# Patient Record
Sex: Male | Born: 2005 | Race: White | Hispanic: No | Marital: Single | Smoking: Never smoker
Health system: Southern US, Community
[De-identification: ages and names within clinical notes are randomized; demographics above are authoritative.]

## PROBLEM LIST (undated history)

## (undated) DIAGNOSIS — F419 Anxiety disorder, unspecified: Secondary | ICD-10-CM

## (undated) HISTORY — DX: Anxiety disorder, unspecified: F41.9

---
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
## 2023-02-06 ENCOUNTER — Encounter: Payer: Self-pay | Admitting: Internal Medicine

## 2023-02-06 ENCOUNTER — Ambulatory Visit: Payer: No Typology Code available for payment source | Admitting: Internal Medicine

## 2023-02-06 VITALS — BP 120/84 | HR 83 | Temp 99.9°F | Ht 70.09 in | Wt 131.0 lb

## 2023-02-06 DIAGNOSIS — Z79899 Other long term (current) drug therapy: Secondary | ICD-10-CM

## 2023-02-06 DIAGNOSIS — F900 Attention-deficit hyperactivity disorder, predominantly inattentive type: Secondary | ICD-10-CM

## 2023-02-06 MED ORDER — METHYLPHENIDATE HCL ER (CD) 60 MG PO CPCR: 60.0000 mg | ORAL_CAPSULE | ORAL | 0 refills | Status: DC

## 2023-02-06 NOTE — Progress Notes (Signed)
[facility]: [phone]   Routine Medical Office Visit  Patient:  Nathan Hill      Age: 17 y.o.       Sex:  male  Date:   02/06/2023 PCP:    Nathan Olszewski, MD   Today's Healthcare Provider: Lula Olszewski, MD   Assessment and Plan:   AI-Extracted Assessment and Plan    Attention Deficit Hyperactivity Disorder (ADHD): He is stable on his current regimen of Strattera 10mg  daily and Metadate CD 60mg  daily. We discussed the possibility of increasing the Strattera dose due to its low current dose; however, he expressed hesitance and a preference to maintain the current dose. We will continue Strattera 10mg  daily and Metadate CD 60mg  daily. We will refill Metadate CD 60mg  for 90 days, to be filled when due. He is encouraged to use medications as prescribed and to maintain regular follow-up. Follow-up in 90-95 days for medication management and to assess ADHD control.        Charting-Extracted Assessment and Plan [name] was seen today for 3 month follow-up.  Attention deficit hyperactivity disorder (ADHD), predominantly inattentive type Overview: Takes methylphenidate and atomoxetine since age 4th-5th grade Reports these work well, the atomoxetine is because he is maxed out on high dose methylphenidate, but was nervous about dose increasing straterra Pediatric records in chart confirming adhd diagnosis  Assessment & Plan: Offered to increase Strattera but makes him nervous Updated problem overview / problem list entry for this problem  Last prescription drug monitoring program check: PDMP reviewed during this encounter. 02/06/2023  Refilled medication(s) unchanged as per patient request using electronic prescribing of controlled substances, sequential prescription for 90 days supply. 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 He has history doubting this diagnosis and so I had referred care for neuropsych  evaluate but haven't received analysis Autism spectrum disorder seems likely but fulll testing yet to confirm.  Even if autism spectrum disorder is diagnosed I would recommend continue Attention Deficit Hyperactivity Disorder (ADHD) medication(s)   Orders: -     Methylphenidate HCl ER (CD); Take 1 capsule (60 mg total) by mouth every morning.  Dispense: 30 capsule; Refill: 0 -     Methylphenidate HCl ER (CD); Take 1 capsule (60 mg total) by mouth every morning.  Dispense: 30 capsule; Refill: 0 -     Methylphenidate HCl ER (CD); Take 1 capsule (60 mg total) by mouth every morning.  Dispense: 30 capsule; Refill: 0  High risk medication use Overview: Methylphenidate 60 mg long term for Attention Deficit Hyperactivity Disorder (ADHD)  Controlled sub contract 05/13/22 uds 05/13/22 Last prescription drug monitoring program check: PDMP reviewed during this encounter. 02/06/2023  Adherence: He reports excellent adherence to medication regimen and expectations. No evidence of misuse or diversion identified at this visit.  Assessment & Plan: Updated problem overview / problem list entry for this problem  Last prescription drug monitoring program check: PDMP reviewed during this encounter. 02/06/2023  Refilled medication(s) unchanged as per patient request using electronic prescribing of controlled substances, sequential prescription for 90 days supply. 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 He has history doubting this diagnosis and so I had referred care for neuropsych evaluate but haven't received analysis Autism spectrum disorder seems likely but fulll testing yet to confirm.  Even if autism spectrum disorder is diagnosed I would recommend continue Attention Deficit Hyperactivity Disorder (ADHD) medication(s)      Treatment plan discussed and reviewed  in detail. Explained medication safety and potential side effects.  Answered all patient questions and  confirmed understanding and comfort with the plan. Encouraged patient to contact our office if they have any questions or concerns. Agreed on patient returning to office if symptoms worsen, persist, or new symptoms develop. Discussed precautions in case of needing to visit the Emergency Department.         Clinical Presentation:    17 y.o. male here today for 3 month follow-up  AI-Extracted: Discussed the use of AI scribe software for clinical note transcription with the patient, who gave verbal consent to proceed.  History of Present Illness   The patient, currently on Strattera 10 mg daily and Metadate CD 60 mg daily for ADHD, presented for a medication review. He reported no issues with the current regimen and denied any new symptoms. The patient expressed satisfaction with the current dose of Strattera, despite the provider's suggestion of a potential increase due to its relatively low dosage. He also reported having approximately ten doses of methylphenidate left and inquired about the process for prescription renewal.  His employment was discussed, with the patient expressing a desire for more consistent work hours. His medication adherence was commended, and he was reminded of the importance of these medications in supporting his daily functioning and potential career advancement.  The patient was reminded of the need for regular 90-day follow-ups due to the regulations surrounding his prescribed medications.        Reviewed chart data: Active Ambulatory Problems    Diagnosis Date Noted   Attention deficit hyperactivity disorder 03/12/2020   Scoliosis 05/13/2022   High risk medication use 05/13/2022   Cognitive and neurobehavioral dysfunction 08/12/2022   Resolved Ambulatory Problems    Diagnosis Date Noted   Sinusitis 12/31/2020   Medication monitoring encounter 12/31/2020   Past Medical History:  Diagnosis Date   Anxiety     Outpatient Medications Prior to Visit   Medication Sig   Adapalene-Benzoyl Peroxide 0.3-2.5 % GEL 1 application Externally Once at night for 30 days   atomoxetine (STRATTERA) 10 MG capsule Take 1 capsule (10 mg total) by mouth daily.   Benzoyl Peroxide (PANOXYL FOAMING WASH) 10 % LIQD 1 application Externally Once a day   chlorhexidine (PERIDEX) 0.12 % solution SMARTSIG:0.5 Ounce(s) By Mouth Twice Daily   clindamycin (CLINDAGEL) 1 % gel 1 application Externally Pea size amount to whole face in the morning   methylphenidate (METADATE CD) 60 MG CR capsule Take 1 capsule (60 mg total) by mouth every morning.   methylphenidate (METADATE CD) 60 MG CR capsule Take 1 capsule (60 mg total) by mouth every morning.   tretinoin (RETIN-A) 0.025 % cream 1 application in the evening to face Externally Pea size amount to whole face every other night for 30 days   [DISCONTINUED] methylphenidate (METADATE CD) 60 MG CR capsule Take 1 capsule (60 mg total) by mouth every morning.   No facility-administered medications prior to visit.         Clinical Data Analysis:   Physical Exam  BP 120/84   Pulse 83   Temp 99.9 F (37.7 C) (Temporal)   Ht 5' 10.09" (1.78 m)   Wt 131 lb (59.4 kg)   SpO2 98%   BMI 18.75 kg/m  Wt Readings from Last 10 Encounters:  02/06/23 131 lb (59.4 kg) (14 %, Z= -1.09)*  11/16/22 135 lb 12.8 oz (61.6 kg) (21 %, Z= -0.79)*  08/12/22 135 lb 12.8 oz (61.6 kg) (  23 %, Z= -0.75)*  05/13/22 136 lb 12.8 oz (62.1 kg) (26 %, Z= -0.65)*   * Growth percentiles are based on CDC (Boys, 2-20 Years) data.   Vital signs reviewed.  Nursing notes reviewed. Weight trend reviewed. Abnormalities and Problem-Specific physical exam findings:  seems anxious, polite.  General Appearance:  No acute distress appreciable.   Well-groomed, healthy-appearing male.  Well proportioned with no abnormal fat distribution.  Good muscle tone. Skin: Clear and well-hydrated. Pulmonary:  Normal work of breathing at rest, no respiratory distress apparent.  SpO2: 98 %  Musculoskeletal: All extremities are intact.  Neurological:  Awake, alert, oriented, and engaged.  No obvious focal neurological deficits or cognitive impairments.  Sensorium seems unclouded.   Speech is clear and coherent with logical content. Psychiatric:  Appropriate mood, pleasant and cooperative demeanor, thoughtful and engaged during the exam    Signed: Lula Olszewski, MD 02/06/2023 8:38 PM

## 2023-02-06 NOTE — Assessment & Plan Note (Addendum)
Offered to increase Strattera but makes him nervous Updated problem overview / problem list entry for this problem  Last prescription drug monitoring program check: PDMP reviewed during this encounter. 02/06/2023  Refilled medication(s) unchanged as per patient request using electronic prescribing of controlled substances, sequential prescription for 90 days supply. 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 He has history doubting this diagnosis and so I had referred care for neuropsych evaluate but haven't received analysis Autism spectrum disorder seems likely but fulll testing yet to confirm.  Even if autism spectrum disorder is diagnosed I would recommend continue Attention Deficit Hyperactivity Disorder (ADHD) medication(s)

## 2023-02-06 NOTE — Assessment & Plan Note (Addendum)
Updated problem overview / problem list entry for this problem  Last prescription drug monitoring program check: PDMP reviewed during this encounter. 02/06/2023  Refilled medication(s) unchanged as per patient request using electronic prescribing of controlled substances, sequential prescription for 90 days supply. 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 He has history doubting this diagnosis and so I had referred care for neuropsych evaluate but haven't received analysis Autism spectrum disorder seems likely but fulll testing yet to confirm.  Even if autism spectrum disorder is diagnosed I would recommend continue Attention Deficit Hyperactivity Disorder (ADHD) medication(s)

## 2023-04-13 ENCOUNTER — Encounter (INDEPENDENT_AMBULATORY_CARE_PROVIDER_SITE_OTHER): Payer: Self-pay

## 2023-04-16 ENCOUNTER — Encounter: Payer: Self-pay | Admitting: Internal Medicine

## 2023-04-27 ENCOUNTER — Ambulatory Visit: Payer: No Typology Code available for payment source | Admitting: Internal Medicine

## 2023-04-27 ENCOUNTER — Encounter: Payer: Self-pay | Admitting: Internal Medicine

## 2023-04-27 VITALS — BP 100/78 | HR 99 | Temp 100.3°F | Ht 70.11 in | Wt 129.6 lb

## 2023-04-27 DIAGNOSIS — Z79899 Other long term (current) drug therapy: Secondary | ICD-10-CM | POA: Diagnosis not present

## 2023-04-27 DIAGNOSIS — F900 Attention-deficit hyperactivity disorder, predominantly inattentive type: Secondary | ICD-10-CM

## 2023-04-27 DIAGNOSIS — F840 Autistic disorder: Secondary | ICD-10-CM | POA: Diagnosis not present

## 2023-04-27 MED ORDER — METHYLPHENIDATE HCL ER (CD) 60 MG PO CPCR: 60.0000 mg | ORAL_CAPSULE | ORAL | 0 refills | Status: DC

## 2023-04-27 MED ORDER — ATOMOXETINE HCL 10 MG PO CAPS: 10.0000 mg | ORAL_CAPSULE | Freq: Every day | ORAL | 3 refills | Status: DC

## 2023-04-27 MED ORDER — METHYLPHENIDATE HCL ER (CD) 60 MG PO CPCR: 60.0000 mg | ORAL_CAPSULE | Freq: Every morning | ORAL | 0 refills | Status: DC

## 2023-04-27 NOTE — Progress Notes (Signed)
[facility]: [phone]   -- Medical Office Visit --  Patient:  Nathan Hill      Age: 17 y.o.       Sex:  male  Date:   04/27/2023 Patient Care Team: Lula Olszewski, MD as PCP - General (Internal Medicine) Today's Healthcare Provider: Lula Olszewski, MD      Assessment & Plan Attention deficit hyperactivity disorder (ADHD), predominantly inattentive type He is currently treated with Metadate CD 60mg  and Straterra for ADHD and finds the medication effective, yet he is open to exploring other options. We discussed the potential benefits of [facility] testing to identify the most suitable medication. We will refill Metadate CD 60mg  and Straterra for three months and order Genesight testing once he has signed up on the website. Based on the [place] results, we may adjust his medication. High risk medication use Assessment: Risks and benefits were weighed and continued maintenance of the controlled substance prescription will be provided.   Continued education about risks and benefits and safe use was also provided.  Importance of securing medications has been reviewed.  Relevant comorbid conditions include.  These factors are taken into account in the overall treatment plan to minimize potential interactions or complications.    PDMP reviewed during this encounter.  Reviewed Urine Drug Screening Data in problem overview needs repeat soon but not able to pee today. Placed future order  We discussed the necessity of periodic drug screening and pill counts due to the use of controlled substances. We will order a future urine drug screen and request he provide a photo of his pill organizer as proof of consistent medication use.  Autism spectrum disorder He is currently undergoing a diagnostic evaluation for Autism Spectrum Disorder. We discussed how a confirmed diagnosis could benefit him by enhancing his understanding and management of the condition. The ongoing  diagnostic evaluation will continue. He is interested in [employer] testing and I helped him to understand his options for getting that test- see AVS   Follow-up is scheduled for 90 days or sooner if [place] results are available.        Diagnoses and all orders for this visit: High risk medication use Attention deficit hyperactivity disorder (ADHD), predominantly inattentive type -     methylphenidate (METADATE CD) 60 MG CR capsule; Take 1 capsule (60 mg total) by mouth every morning. -     methylphenidate (METADATE CD) 60 MG CR capsule; Take 1 capsule (60 mg total) by mouth every morning. -     methylphenidate (METADATE CD) 60 MG CR capsule; Take 1 capsule (60 mg total) by mouth every morning. -     atomoxetine (STRATTERA) 10 MG capsule; Take 1 capsule (10 mg total) by mouth daily. -     Drug Screen, 5 Panel, Ur; Future Adolescent idiopathic scoliosis of thoracolumbar region Autism spectrum disorder  Recommended follow-up: 90 days  Future Appointments  Date Time Provider Department Center  06/22/2023  4:00 PM Altabet, Salvatore Decent, PhD [facility] None  06/28/2023 12:30 PM Altabet, Salvatore Decent, PhD [facility] None  06/29/2023 12:30 PM Altabet, Salvatore Decent, PhD [facility] None  07/07/2023 10:30 AM Altabet, Salvatore Decent, PhD [facility] None  07/31/2023  1:40 PM Lula Olszewski, MD [facility]            Subjective   17 y.o. male who has Attention deficit hyperactivity disorder; Scoliosis; High risk medication use; and Cognitive and neurobehavioral dysfunction on their problem list. His reasons/main concerns/chief complaints for  today's office visit are 3 month follow-up   ------------------------------------------------------------------------------------------------------------------------ AI-Extracted: Discussed the use of AI scribe software for clinical note transcription with the patient, who gave verbal consent to proceed.  History of Present Illness   The patient, who is  currently undergoing therapy for autism, presented primarily to discuss the [facility] testing. He has only had one session of therapy so far, which involved answering questions to confirm the diagnosis of autism. The patient is also on medication for ADHD, specifically Methylphenidate and Stratara, which he has been taking consistently. He expressed uncertainty about the effectiveness of the current ADHD medication due to the long duration of use and is interested in exploring other potentially more effective options through [place] testing. The patient is compliant with medication and uses a weekly pill organizer to ensure consistent intake. No new symptoms or health concerns were reported during this visit.      He has a past medical history of Anxiety and Scoliosis (05/13/2022).  Problem list overviews that were updated at today's visit: Problem  High Risk Medication Use   Methylphenidate 60 mg long term for Attention Deficit Hyperactivity Disorder (ADHD)  Controlled sub contract 05/13/22 uds 05/13/22 Last prescription drug monitoring program check: PDMP reviewed during this encounter. 02/06/2023  Adherence: He reports excellent adherence to medication regimen and expectations. No evidence of misuse or diversion identified at this visit.   Attention Deficit Hyperactivity Disorder   Takes methylphenidate and atomoxetine since age 4th-5th grade Reports these work well, the atomoxetine is because he is maxed out on high dose methylphenidate, but was nervous about dose increasing straterra Pediatric records in chart confirming adhd diagnosis    Current Outpatient Medications on File Prior to Visit  Medication Sig   Adapalene-Benzoyl Peroxide 0.3-2.5 % GEL 1 application Externally Once at night for 30 days   Benzoyl Peroxide (PANOXYL FOAMING WASH) 10 % LIQD 1 application Externally Once a day   chlorhexidine (PERIDEX) 0.12 % solution SMARTSIG:0.5 Ounce(s) By Mouth Twice Daily   clindamycin  (CLINDAGEL) 1 % gel 1 application Externally Pea size amount to whole face in the morning   methylphenidate (METADATE CD) 60 MG CR capsule Take 1 capsule (60 mg total) by mouth every morning.   methylphenidate (METADATE CD) 60 MG CR capsule Take 1 capsule (60 mg total) by mouth every morning.   tretinoin (RETIN-A) 0.025 % cream 1 application in the evening to face Externally Pea size amount to whole face every other night for 30 days   No current facility-administered medications on file prior to visit.   Medications Discontinued During This Encounter  Medication Reason   clindamycin (CLINDAGEL) 1 % gel    atomoxetine (STRATTERA) 10 MG capsule Reorder   methylphenidate (METADATE CD) 60 MG CR capsule Reorder   methylphenidate (METADATE CD) 60 MG CR capsule Reorder   methylphenidate (METADATE CD) 60 MG CR capsule Reorder     Objective   Physical Exam  BP 100/78 (BP Location: Left Arm, Patient Position: Sitting)   Pulse 99   Temp 100.3 F (37.9 C) (Temporal)   Ht 5' 10.11" (1.781 m)   Wt 129 lb 9.6 oz (58.8 kg)   SpO2 97%   BMI 18.54 kg/m  Wt Readings from Last 10 Encounters:  04/27/23 129 lb 9.6 oz (58.8 kg) (12%, Z= -1.20)*  02/06/23 131 lb (59.4 kg) (14%, Z= -1.09)*  11/16/22 135 lb 12.8 oz (61.6 kg) (21%, Z= -0.79)*  08/12/22 135 lb 12.8 oz (61.6 kg) (23%, Z= -0.75)*  05/13/22  136 lb 12.8 oz (62.1 kg) (26%, Z= -0.65)*   * Growth percentiles are based on CDC (Boys, 2-20 Years) data.   Vital signs reviewed.  Nursing notes reviewed. Weight trend reviewed. Abnormalities and Problem-Specific physical exam findings:  stimming, unusual speech patterning consistent with prior visits and autism spectrum disorder   General Appearance:  No acute distress appreciable.   Well-groomed, healthy-appearing male.  Well proportioned with no abnormal fat distribution.  Good muscle tone. Pulmonary:  Normal work of breathing at rest, no respiratory distress apparent. SpO2: 97 %  Musculoskeletal:  All extremities are intact.  Neurological:  Awake, alert, oriented, and engaged.  No obvious focal neurological deficits or cognitive impairments.  Sensorium seems unclouded.   Speech is clear and coherent with logical content. Psychiatric:  Appropriate mood, pleasant and cooperative demeanor, thoughtful and engaged during the exam  Results            No results found for any visits on 04/27/23.  Lab on 08/18/2022  Component Date Value   WBC 08/18/2022 7.2    RBC 08/18/2022 5.53    Hemoglobin 08/18/2022 15.8    HCT 08/18/2022 47.2    MCV 08/18/2022 85.3    MCHC 08/18/2022 33.6    RDW 08/18/2022 13.2    Platelets 08/18/2022 274.0    Neutrophils Relative % 08/18/2022 41.0 (L)    Lymphocytes Relative 08/18/2022 38.5    Monocytes Relative 08/18/2022 9.9    Eosinophils Relative 08/18/2022 9.9 (H)    Basophils Relative 08/18/2022 0.7    Neutro Abs 08/18/2022 2.9    Lymphs Abs 08/18/2022 2.8    Monocytes Absolute 08/18/2022 0.7    Eosinophils Absolute 08/18/2022 0.7    Basophils Absolute 08/18/2022 0.1    Sodium 08/18/2022 140    Potassium 08/18/2022 4.0    Chloride 08/18/2022 102    CO2 08/18/2022 31    Glucose, Bld 08/18/2022 83    BUN 08/18/2022 10    Creatinine, Ser 08/18/2022 1.01    Total Bilirubin 08/18/2022 0.4    Alkaline Phosphatase 08/18/2022 80    AST 08/18/2022 15    ALT 08/18/2022 12    Total Protein 08/18/2022 7.1    Albumin 08/18/2022 4.6    GFR 08/18/2022 108.25    Calcium 08/18/2022 9.8    Cholesterol 08/18/2022 148    Triglycerides 08/18/2022 95.0    HDL 08/18/2022 52.20    VLDL 08/18/2022 19.0    LDL Cholesterol 08/18/2022 77    Total CHOL/HDL Ratio 08/18/2022 3    NonHDL 08/18/2022 96.06   Office Visit on 05/13/2022  Component Date Value   Alcohol Metabolites 05/13/2022 NEGATIVE    Amphetamines 05/13/2022 NEGATIVE    Benzodiazepines 05/13/2022 NEGATIVE    Buprenorphine, Urine 05/13/2022 NEGATIVE    Cocaine Metabolite 05/13/2022 NEGATIVE    6  Acetylmorphine 05/13/2022 NEGATIVE    Marijuana Metabolite 05/13/2022 NEGATIVE    MDMA 05/13/2022 NEGATIVE    Opiates 05/13/2022 NEGATIVE    Oxycodone 05/13/2022 NEGATIVE    Creatinine 05/13/2022 105.6    pH 05/13/2022 7.3    Oxidant 05/13/2022 NEGATIVE    Notes and Comments 05/13/2022     No image results found.   No results found.  DG Ankle Complete Right  Result Date: 11/26/2010 *RADIOLOGY REPORT* Clinical Data: Fell twisting ankle with pain laterally RIGHT ANKLE - COMPLETE 3+ VIEW Comparison: None. Findings: The ankle joint appears normal.  No acute fracture is seen.  Alignment is normal.  There is soft tissue swelling over lateral malleolus.  IMPRESSION: No acute fracture. Original Report Authenticated By: Juline Patch, M.D.      Additional Info: This encounter employed real-time, collaborative documentation. The patient actively reviewed and updated their medical record on a shared screen, ensuring transparency and facilitating joint problem-solving for the problem list, overview, and plan. This approach promotes accurate, informed care. The treatment plan was discussed and reviewed in detail, including medication safety, potential side effects, and all patient questions. We confirmed understanding and comfort with the plan. Follow-up instructions were established, including contacting the office for any concerns, returning if symptoms worsen, persist, or new symptoms develop, and precautions for potential emergency department visits.

## 2023-04-27 NOTE — Patient Instructions (Signed)
VISIT SUMMARY:  During your visit, we discussed your ongoing therapy for autism and your current medication for ADHD. You expressed interest in exploring other potentially more effective medication options through [place] testing. We also discussed the importance of regular drug screening and pill counts due to your use of controlled substances.  YOUR PLAN:  -AUTISM SPECTRUM DISORDER: You are currently undergoing a diagnostic evaluation for Autism Spectrum Disorder, a condition that affects communication and behavior. This evaluation will continue to help [place] better understand and manage your condition.  -ADHD: You are currently taking Metadate CD 60mg  and Straterra for ADHD, a condition that affects attention and self-control. We will refill your medication for three months and order [facility] testing, which can help identify the most suitable medication for you.  -REGULATORY COMPLIANCE: Due to your use of controlled substances, it's important that we conduct regular drug screenings and pill counts. We will order a future urine drug screen and request a photo of your pill organizer to ensure consistent medication use.  INSTRUCTIONS:  Please sign up for Genesight testing on their website. We will order the test once you have signed up. Follow-up is scheduled for 90 days or sooner if [place] results are available. Please continue to take your medication as prescribed and provide a photo of your pill organizer as proof of consistent use.

## 2023-04-27 NOTE — Assessment & Plan Note (Signed)
He is currently treated with Metadate CD 60mg  and Straterra for ADHD and finds the medication effective, yet he is open to exploring other options. We discussed the potential benefits of Genesight testing to identify the most suitable medication. We will refill Metadate CD 60mg  and Straterra for three months and order [facility] testing once he has signed up on the website. Based on the [place] results, we may adjust his medication.

## 2023-04-27 NOTE — Assessment & Plan Note (Addendum)
Assessment: Risks and benefits were weighed and continued maintenance of the controlled substance prescription will be provided.   Continued education about risks and benefits and safe use was also provided.  Importance of securing medications has been reviewed.  Relevant comorbid conditions include.  These factors are taken into account in the overall treatment plan to minimize potential interactions or complications.    PDMP reviewed during this encounter.  Reviewed Urine Drug Screening Data in problem overview needs repeat soon but not able to pee today. Placed future order  We discussed the necessity of periodic drug screening and pill counts due to the use of controlled substances. We will order a future urine drug screen and request he provide a photo of his pill organizer as proof of consistent medication use.

## 2023-05-10 ENCOUNTER — Ambulatory Visit: Payer: No Typology Code available for payment source | Admitting: Internal Medicine

## 2023-06-22 ENCOUNTER — Ambulatory Visit: Payer: No Typology Code available for payment source | Admitting: Psychology

## 2023-06-28 ENCOUNTER — Other Ambulatory Visit: Payer: Self-pay | Admitting: Internal Medicine

## 2023-06-28 ENCOUNTER — Ambulatory Visit: Payer: No Typology Code available for payment source | Admitting: Psychology

## 2023-06-28 DIAGNOSIS — F900 Attention-deficit hyperactivity disorder, predominantly inattentive type: Secondary | ICD-10-CM

## 2023-06-29 ENCOUNTER — Ambulatory Visit: Payer: No Typology Code available for payment source | Admitting: Psychology

## 2023-07-03 ENCOUNTER — Ambulatory Visit: Payer: No Typology Code available for payment source | Admitting: Internal Medicine

## 2023-07-03 VITALS — BP 117/72 | HR 80 | Temp 98.4°F | Ht 70.0 in | Wt 132.0 lb

## 2023-07-03 DIAGNOSIS — Z79899 Other long term (current) drug therapy: Secondary | ICD-10-CM | POA: Diagnosis not present

## 2023-07-03 DIAGNOSIS — F900 Attention-deficit hyperactivity disorder, predominantly inattentive type: Secondary | ICD-10-CM

## 2023-07-03 DIAGNOSIS — F09 Unspecified mental disorder due to known physiological condition: Secondary | ICD-10-CM | POA: Diagnosis not present

## 2023-07-03 DIAGNOSIS — F902 Attention-deficit hyperactivity disorder, combined type: Secondary | ICD-10-CM

## 2023-07-03 MED ORDER — METHYLPHENIDATE HCL ER (CD) 60 MG PO CPCR: 60.0000 mg | ORAL_CAPSULE | ORAL | 0 refills | Status: DC

## 2023-07-03 NOTE — Assessment & Plan Note (Addendum)
Assessment: Risks and benefits were weighed and continued maintenance of the controlled substance prescription will be provided.   Continued education about risks and benefits and safe use was also provided.  Importance of securing medications has been reviewed. Relevant comorbid conditions include suspected neurocognitive disorder(s) (autism spectrum disorder not confirmed).  These factors are taken into account in the overall treatment plan to minimize potential interactions or complications.   Advised patient to upload images of pills monthly if possible to demonstrate adherence and prevent diversion.  PDMP reviewed during this encounter. Regular fill pattern Reviewed Urine Drug Screening Data in problem overview due for repeat, obtained today New controlled substance(s) contract completed today 07/03/23   Adherence:  No evidence of misuse or diversion has been identified during this visit, and there is no suspicion of such behavior.  Patient has  been briefed on our diversion prevention protocol, which is integral to our medication management strategy.  Explained and confirmed understanding of the importance of bringing medications in their original containers for verification, the option of submitting time-stamped medication photos via MyChart, and the necessity of routine urine drug screenings.   Terms were agreed upon, signed by written contract, and this is in place to ensure the safe and effective use of medically necessary controlled substance prescriptions, and to fulfill our regulatory obligations.  Adherence to our prescribing agreement has been exemplary, with no indications of misuse or diversion. We will continue to monitor and support the agreed-upon treatment plan, ensuring compliance with safety protocols and regulatory requirements.

## 2023-07-03 NOTE — Assessment & Plan Note (Addendum)
At last visit - Metadate CD 60mg  and Straterra for ADHD and finds the medication effective, and we discussed the potential benefits of [facility] testing to identify the most suitable medication.  We don't have the results for adjustment... but he seems to be doing well with current so not interested in adjusting

## 2023-07-03 NOTE — Patient Instructions (Signed)
VISIT SUMMARY:  During today's visit, we reviewed your current ADHD medication regimen. You reported that you are satisfied with the effectiveness of your medications, Metadate CD 60mg  and Strattera, and have not experienced any adverse effects. We discussed your adherence to the medication schedule and confirmed that you are generally following it well. We also planned for a routine drug screening and updated your controlled substance contract.  YOUR PLAN:  -ADHD: ADHD, or Attention-Deficit/Hyperactivity Disorder, is a condition characterized by symptoms of inattention, hyperactivity, and impulsivity. You are currently stable on Metadate CD 60mg  and Strattera, with no reported side effects. We will continue with your current medications and monitor your adherence through periodic photos of your pill bottles and containers. A routine drug screening was performed today, and we updated your controlled substance contract. Please confirm with your pharmacy regarding your refill schedule to help decide timing for next appointment, which can be video if desired.  INSTRUCTIONS:  Your next appointment is scheduled for approximately 90 days. Please confirm the date with the office. Additionally, ensure you confirm your refill schedule with the pharmacy. Consider scheduling a wellness visit in 90 days.

## 2023-07-03 NOTE — Progress Notes (Signed)
[facility]: [phone]   -- Medical Office Visit --  Patient:  Nathan Hill      Age: 17 y.o.       Sex:  male  Date:   07/03/2023 Today's Healthcare Provider: Lula Olszewski, MD  =============================================================================================     Assessment & Plan Attention deficit hyperactivity disorder (ADHD), combined type At last visit - Metadate CD 60mg  and Straterra for ADHD and finds the medication effective, and we discussed the potential benefits of Genesight testing to identify the most suitable medication.  We don't have the results for adjustment... but he seems to be doing well with current so not interested in adjusting High risk medication use Assessment: Risks and benefits were weighed and continued maintenance of the controlled substance prescription will be provided.   Continued education about risks and benefits and safe use was also provided.  Importance of securing medications has been reviewed. Relevant comorbid conditions include suspected neurocognitive disorder(s) (autism spectrum disorder not confirmed).  These factors are taken into account in the overall treatment plan to minimize potential interactions or complications.   Advised patient to upload images of pills monthly if possible to demonstrate adherence and prevent diversion.  PDMP reviewed during this encounter. Regular fill pattern Reviewed Urine Drug Screening Data in problem overview due for repeat, obtained today New controlled substance(s) contract completed today 07/03/23   Adherence:  No evidence of misuse or diversion has been identified during this visit, and there is no suspicion of such behavior.  Patient has  been briefed on our diversion prevention protocol, which is integral to our medication management strategy.  Explained and confirmed understanding of the importance of bringing medications in their original containers for  verification, the option of submitting time-stamped medication photos via MyChart, and the necessity of routine urine drug screenings.   Terms were agreed upon, signed by written contract, and this is in place to ensure the safe and effective use of medically necessary controlled substance prescriptions, and to fulfill our regulatory obligations.  Adherence to our prescribing agreement has been exemplary, with no indications of misuse or diversion. We will continue to monitor and support the agreed-upon treatment plan, ensuring compliance with safety protocols and regulatory requirements.  Cognitive and neurobehavioral dysfunction  Attention deficit hyperactivity disorder (ADHD), predominantly inattentive type At last visit - Metadate CD 60mg  and Straterra for ADHD and finds the medication effective, and we discussed the potential benefits of Genesight testing to identify the most suitable medication.  We don't have the results for adjustment... but he seems to be doing well with current Assessment and Plan   ADHD They remain stable on their current regimen of Metadate CD 60mg  and Strattera, reporting no side effects such as anxiety, insomnia, or palpitations. The medication remains effective until around 7pm, although they occasionally skip doses, their overall adherence is good. We will continue Metadate CD 60mg  and Strattera. To ensure compliance and prevent diversion, they will send periodic photos of pill bottles and pill container. Today, we will perform a routine drug screening and refresh the controlled substance contract. The next appointment is scheduled for approximately 90 days, with a potential for a wellness visit. They will confirm with the pharmacy regarding the refill schedule.     Diagnoses and all orders for this visit: Attention deficit hyperactivity disorder (ADHD), combined type -     methylphenidate (METADATE CD) 60 MG CR capsule; Take 1 capsule (60 mg total) by mouth every  morning. -  methylphenidate (METADATE CD) 60 MG CR capsule; Take 1 capsule (60 mg total) by mouth every morning. -     methylphenidate (METADATE CD) 60 MG CR capsule; Take 1 capsule (60 mg total) by mouth every morning. 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 -     methylphenidate (METADATE CD) 60 MG CR capsule; Take 1 capsule (60 mg total) by mouth every morning. -     methylphenidate (METADATE CD) 60 MG CR capsule; Take 1 capsule (60 mg total) by mouth every morning.  Recommended follow-up: No follow-ups on file. Future Appointments  Date Time Provider Department Center  10/04/2023  1:20 PM Lula Olszewski, MD [facility]  Patient Care Team: Lula Olszewski, MD as PCP - General (Internal Medicine)    SUBJECTIVE: 17 y.o. male who has Attention deficit hyperactivity disorder; Scoliosis; High risk medication use; and Cognitive and neurobehavioral dysfunction on their problem list.. Main reasons for visit/main concerns/chief complaint: Medication Refill  ------------------------------------------------------------------------------------------------------------------------ AI-Extracted: Discussed the use of AI scribe software for clinical note transcription with the patient, who gave verbal consent to proceed.  History of Present Illness   The patient, currently on methylphenidate for ADHD management, reports satisfaction with the current medication regimen. They note that the medication is typically taken around 8:30-9:00 AM and lasts until approximately 7:00 PM. The patient denies any adverse effects such as anxiety, insomnia, or palpitations. They report occasional days where the medication is not taken, but overall adherence to the prescribed  regimen is good. The patient is also on a combination of metacd 60 and stratarotin, with no reported issues. The patient's medication adherence and management are monitored through periodic photographs of the medication bottles and pill containers, sent to the provider.       Note that patient  has a past medical history of Anxiety and Scoliosis (05/13/2022).  Problem list overviews that were updated at today's visit: Problem  High Risk Medication Use   Methylphenidate 60 mg long term for Attention Deficit Hyperactivity Disorder (ADHD)  Controlled sub contract 05/13/22 uds 05/13/22 Last prescription drug monitoring program check: PDMP reviewed during this encounter. 02/06/2023  Adherence: He reports excellent adherence to medication regimen and expectations. No evidence of misuse or diversion identified at this visit.   Attention Deficit Hyperactivity Disorder   Takes methylphenidate and atomoxetine since age 4th-5th grade Reports these work well, the atomoxetine is because he is maxed out on high dose methylphenidate, but was nervous about dose increasing straterra Pediatric records in chart confirming adhd diagnosis     Med reconciliation: Current Outpatient Medications on File Prior to Visit  Medication Sig   Adapalene-Benzoyl Peroxide 0.3-2.5 % GEL 1 application Externally Once at night for 30 days   atomoxetine (STRATTERA) 10 MG capsule Take 1 capsule (10 mg total) by mouth daily.   Benzoyl Peroxide (PANOXYL FOAMING WASH) 10 % LIQD 1 application Externally Once a day   chlorhexidine (PERIDEX) 0.12 % solution SMARTSIG:0.5 Ounce(s) By Mouth Twice Daily   clindamycin (CLINDAGEL) 1 % gel 1 application Externally Pea size amount to whole face in the morning   methylphenidate (METADATE CD) 60 MG CR capsule Take 1 capsule (60 mg total) by mouth every morning.   tretinoin (RETIN-A) 0.025 % cream 1 application in the  evening to face Externally Pea size amount to whole face every other night for  30 days   methylphenidate (METADATE CD) 60 MG CR capsule Take 1 capsule (60 mg total) by mouth every morning.   No current facility-administered medications on file prior to visit.   Medications Discontinued During This Encounter  Medication Reason   methylphenidate (METADATE CD) 60 MG CR capsule Reorder   methylphenidate (METADATE CD) 60 MG CR capsule Reorder   methylphenidate (METADATE CD) 60 MG CR capsule Reorder     Objective   Physical Exam     07/03/2023    8:10 AM 04/27/2023   11:11 AM 02/06/2023    1:58 PM  Vitals with BMI  Height 5\' 10"  5' 10.11" 5' 10.09"  Weight 132 lbs 129 lbs 10 oz 131 lbs  BMI 18.94 18.53 18.75  Systolic 117 100 161  Diastolic 72 78 84  Pulse 80 99 83   Wt Readings from Last 10 Encounters:  07/03/23 132 lb (59.9 kg) (14%, Z= -1.09)*  04/27/23 129 lb 9.6 oz (58.8 kg) (12%, Z= -1.20)*  02/06/23 131 lb (59.4 kg) (14%, Z= -1.09)*  11/16/22 135 lb 12.8 oz (61.6 kg) (21%, Z= -0.79)*  08/12/22 135 lb 12.8 oz (61.6 kg) (23%, Z= -0.75)*  05/13/22 136 lb 12.8 oz (62.1 kg) (26%, Z= -0.65)*   * Growth percentiles are based on CDC (Boys, 2-20 Years) data.   Vital signs reviewed.  Nursing notes reviewed. Weight trend reviewed. Abnormalities and Problem-Specific physical exam findings:  mental status unchanged. Provides very clear answers to questions. Shy and polite. Weight stable. General Appearance:  No acute distress appreciable.   Well-groomed, healthy-appearing male.  Well proportioned with no abnormal fat distribution.  Good muscle tone. Pulmonary:  Normal work of breathing at rest, no respiratory distress apparent. SpO2: 100 %  Musculoskeletal: All extremities are intact.  Neurological:  Awake, alert, oriented, and engaged.  No obvious focal neurological deficits or cognitive impairments.  Sensorium seems unclouded.   Speech is clear and coherent with logical content. Psychiatric:  Appropriate mood, pleasant and cooperative demeanor, thoughtful and  engaged during the exam  Results            No results found for any visits on 07/03/23.  Lab on 08/18/2022  Component Date Value   WBC 08/18/2022 7.2    RBC 08/18/2022 5.53    Hemoglobin 08/18/2022 15.8    HCT 08/18/2022 47.2    MCV 08/18/2022 85.3    MCHC 08/18/2022 33.6    RDW 08/18/2022 13.2    Platelets 08/18/2022 274.0    Neutrophils Relative % 08/18/2022 41.0 (L)    Lymphocytes Relative 08/18/2022 38.5    Monocytes Relative 08/18/2022 9.9    Eosinophils Relative 08/18/2022 9.9 (H)    Basophils Relative 08/18/2022 0.7    Neutro Abs 08/18/2022 2.9    Lymphs Abs 08/18/2022 2.8    Monocytes Absolute 08/18/2022 0.7    Eosinophils Absolute 08/18/2022 0.7    Basophils Absolute 08/18/2022 0.1    Sodium 08/18/2022 140    Potassium 08/18/2022 4.0    Chloride 08/18/2022 102    CO2 08/18/2022 31    Glucose, Bld 08/18/2022 83    BUN 08/18/2022 10    Creatinine, Ser 08/18/2022 1.01    Total Bilirubin 08/18/2022 0.4    Alkaline Phosphatase 08/18/2022 80    AST 08/18/2022 15    ALT 08/18/2022 12    Total Protein 08/18/2022 7.1    Albumin 08/18/2022 4.6  GFR 08/18/2022 108.25    Calcium 08/18/2022 9.8    Cholesterol 08/18/2022 148    Triglycerides 08/18/2022 95.0    HDL 08/18/2022 52.20    VLDL 08/18/2022 19.0    LDL Cholesterol 08/18/2022 77    Total CHOL/HDL Ratio 08/18/2022 3    NonHDL 08/18/2022 96.06          Additional Info: This encounter employed real-time, collaborative documentation. The patient actively reviewed and updated their medical record on a shared screen, ensuring transparency and facilitating joint problem-solving for the problem list, overview, and plan. This approach promotes accurate, informed care. The treatment plan was discussed and reviewed in detail, including medication safety, potential side effects, and all patient questions. We confirmed understanding and comfort with the plan. Follow-up instructions were established, including  contacting the office for any concerns, returning if symptoms worsen, persist, or new symptoms develop, and precautions for potential emergency department visits.

## 2023-07-03 NOTE — Assessment & Plan Note (Addendum)
At last visit - Metadate CD 60mg  and Straterra for ADHD and finds the medication effective, and we discussed the potential benefits of Genesight testing to identify the most suitable medication.  We don't have the results for adjustment... but he seems to be doing well with current

## 2023-07-04 LAB — DRUG SCREEN, 5 PANEL, UR
Amphetamines, Urine: NEGATIVE ng/mL
Cannabinoid Quant, Ur: NEGATIVE ng/mL
Cocaine (Metab.): NEGATIVE ng/mL
OPIATE QUANTITATIVE URINE: NEGATIVE ng/mL
PCP Quant, Ur: NEGATIVE ng/mL

## 2023-07-04 NOTE — Telephone Encounter (Signed)
Patient was seen by you for an OV yesterday and this prescription was sent in. You will have to refuse this request being that it's a controlled substance.

## 2023-07-06 NOTE — Telephone Encounter (Signed)
Spoke with pharmacy, confirmed that patient picked up a prescription at the beginning of November and has refills for later. You can refuse this prescription.

## 2023-07-07 ENCOUNTER — Ambulatory Visit: Payer: No Typology Code available for payment source | Admitting: Psychology

## 2023-07-31 ENCOUNTER — Ambulatory Visit: Payer: No Typology Code available for payment source | Admitting: Internal Medicine

## 2023-09-11 ENCOUNTER — Encounter: Payer: Self-pay | Admitting: Internal Medicine

## 2023-10-04 ENCOUNTER — Ambulatory Visit: Payer: No Typology Code available for payment source | Admitting: Internal Medicine

## 2023-10-04 ENCOUNTER — Encounter: Payer: Self-pay | Admitting: Internal Medicine

## 2023-10-04 VITALS — BP 118/75 | HR 102 | Temp 99.7°F | Ht 70.0 in | Wt 132.8 lb

## 2023-10-04 DIAGNOSIS — F902 Attention-deficit hyperactivity disorder, combined type: Secondary | ICD-10-CM | POA: Diagnosis not present

## 2023-10-04 DIAGNOSIS — L700 Acne vulgaris: Secondary | ICD-10-CM

## 2023-10-04 DIAGNOSIS — F900 Attention-deficit hyperactivity disorder, predominantly inattentive type: Secondary | ICD-10-CM

## 2023-10-04 DIAGNOSIS — F09 Unspecified mental disorder due to known physiological condition: Secondary | ICD-10-CM | POA: Diagnosis not present

## 2023-10-04 DIAGNOSIS — Z79899 Other long term (current) drug therapy: Secondary | ICD-10-CM

## 2023-10-04 MED ORDER — ATOMOXETINE HCL 10 MG PO CAPS: 10.0000 mg | ORAL_CAPSULE | Freq: Every day | ORAL | 3 refills | Status: DC

## 2023-10-04 MED ORDER — ADAPALENE-BENZOYL PEROXIDE 0.3-2.5 % EX GEL: 1.0000 | Freq: Every day | CUTANEOUS | 11 refills | Status: AC

## 2023-10-04 MED ORDER — METHYLPHENIDATE HCL ER (CD) 60 MG PO CPCR: 60.0000 mg | ORAL_CAPSULE | ORAL | 0 refills | Status: DC

## 2023-10-05 NOTE — Assessment & Plan Note (Signed)
PDMP reviewed during this encounter.  

## 2023-10-05 NOTE — Progress Notes (Signed)
 ==============================  [facility]: [phone]   -- Medical Office Visit --  Patient: Nathan Hill      Age: 18 y.o.       Sex:  male  Date:   10/04/2023 Today's Healthcare Provider: Bernardino KANDICE Cone, MD  ==============================   CHIEF COMPLAINT: 3 month follow-up and Medication Problem (Wants Attention Deficit Hyperactivity Disorder (ADHD) medication(s) with more long-acting impulse control, more effective for focus, more memory)  SUBJECTIVE: Background This is a 18 y.o. male who has Attention deficit hyperactivity disorder; Scoliosis; High risk medication use; and Cognitive and neurobehavioral dysfunction on their problem list. History of Present Illness Nathan Hill is a 18 year old male with ADHD who presents with concerns about medication efficacy for focus and memory.  He is currently taking 60 mg of extended-release methylphenidate  (Concerta ) and 10 mg of amoxapine. The medication effectively manages his impulse control but does not adequately address his focus and memory issues. He is seeking a longer-acting medication to better support these cognitive functions.  He is undergoing evaluations at a therapy center specializing in autism and related conditions. His therapist has recommended genetic testing to determine the most suitable medication based on his DNA, sharing a personal experience where such testing was beneficial in identifying effective medication.  His emotional status is stable, with no current symptoms of depression or anxiety. He is currently employed. Reviewed chart records that patient  has a past medical history of Anxiety and Scoliosis (05/13/2022).  Current Outpatient Medications on File Prior to Visit  Medication Sig   Benzoyl Peroxide  (PANOXYL FOAMING WASH) 10 % LIQD 1 application Externally Once a day   chlorhexidine (PERIDEX) 0.12 % solution SMARTSIG:0.5 Ounce(s) By Mouth Twice Daily    clindamycin (CLINDAGEL) 1 % gel 1 application Externally Pea size amount to whole face in the morning   methylphenidate  (METADATE  CD) 60 MG CR capsule Take 1 capsule (60 mg total) by mouth every morning.   tretinoin (RETIN-A) 0.025 % cream 1 application in the evening to face Externally Pea size amount to whole face every other night for 30 days   methylphenidate  (METADATE  CD) 60 MG CR capsule Take 1 capsule (60 mg total) by mouth every morning. (Patient not taking: Reported on 10/04/2023)   No current facility-administered medications on file prior to visit.   Medications Discontinued During This Encounter  Medication Reason   methylphenidate  (METADATE  CD) 60 MG CR capsule    methylphenidate  (METADATE  CD) 60 MG CR capsule    methylphenidate  (METADATE  CD) 60 MG CR capsule    Adapalene -Benzoyl Peroxide  0.3-2.5 % GEL Reorder   atomoxetine  (STRATTERA ) 10 MG capsule Reorder    Objective   Physical Exam     10/04/2023    1:22 PM 07/03/2023    8:10 AM 04/27/2023   11:11 AM  Vitals with BMI  Height 5' 10 5' 10 5' 10.11  Weight 132 lbs 13 oz 132 lbs 129 lbs 10 oz  BMI 19.05 18.94 18.53  Systolic 118 117 899  Diastolic 75 72 78  Pulse 102 80 99   Wt Readings from Last 10 Encounters:  10/04/23 132 lb 12.8 oz (60.2 kg)  07/03/23 132 lb (59.9 kg) (14%, Z= -1.09)*  04/27/23 129 lb 9.6 oz (58.8 kg) (12%, Z= -1.20)*  02/06/23 131 lb (59.4 kg) (14%, Z= -1.09)*  11/16/22 135 lb 12.8 oz (61.6 kg) (21%, Z= -0.79)*  08/12/22 135 lb 12.8 oz (61.6 kg) (23%, Z= -0.75)*  05/13/22 136  lb 12.8 oz (62.1 kg) (26%, Z= -0.65)*   * Growth percentiles are based on CDC (Boys, 2-20 Years) data.   Vital signs reviewed.  Nursing notes reviewed. Weight trend reviewed. Abnormalities and Problem-Specific physical exam findings:  behavior consistent with autism spectrum disorder - poor eye contact. Gentle natured.  General Appearance:  No acute distress appreciable.   Well-groomed, healthy-appearing male.  Well  proportioned with no abnormal fat distribution.  Good muscle tone. Pulmonary:  Normal work of breathing at rest, no respiratory distress apparent. SpO2: 98 %  Musculoskeletal: All extremities are intact.  Neurological:  Awake, alert, oriented, and engaged.  No obvious focal neurological deficits or cognitive impairments.  Sensorium seems unclouded.   Speech is clear and coherent with logical content. Psychiatric:  Appropriate mood, pleasant and cooperative demeanor, thoughtful and engaged during the exam    No results found for any visits on 10/04/23. Office Visit on 07/03/2023  Component Date Value   Amphetamines, Urine 07/03/2023 Negative    Cannabinoid Quant, Ur 07/03/2023 Negative    Cocaine (Metab.) 07/03/2023 Negative    OPIATE QUANTITATIVE URINE 07/03/2023 Negative    PCP Quant, Ur 07/03/2023 Negative   No image results found. No results found.DG Ankle Complete Right Result Date: 11/26/2010 *RADIOLOGY REPORT* Clinical Data: Fell twisting ankle with pain laterally RIGHT ANKLE - COMPLETE 3+ VIEW Comparison: None. Findings: The ankle joint appears normal.  No acute fracture is seen.  Alignment is normal.  There is soft tissue swelling over lateral malleolus. IMPRESSION: No acute fracture. Original Report Authenticated By: DEWARD CHARM DAMES, M.D.      Assessment & Plan Attention deficit hyperactivity disorder (ADHD), combined type Attention-Deficit/Hyperactivity Disorder (ADHD) ADHD is managed with 60 mg extended-release methylphenidate  (Concerta ) and 10 mg amoxapine. Impulse control is adequate, but focus and memory remain insufficient. The current regimen is at the maximum dose for Concerta . There is a potential need for new medication due to these limitations. Risks of switching include potential decompensation, but there may be improved outcomes. Evaluations for autism are ongoing, and GeneSight testing is recommended to tailor medication to the genetic profile. Challenges exist in  ordering the GeneSight test due to registration issues. While GeneSight may not assist with ADHD medications, it could be beneficial for depression medications. Prescribe 60 mg extended-release methylphenidate , 30 capsules, for 30 days, to [facility] [address]. Attempt to order GeneSight testing and follow up on its status. Consider alternative medications if GeneSight testing is not feasible or does not provide useful information. Schedule a follow-up appointment in 3 months. Call by the end of the week to update on GeneSight testing status. Call the office next week if there is no update on GeneSight testing. High risk medication use PDMP reviewed during this encounter. Cognitive and neurobehavioral dysfunction Autism spectrum disorder - encouraged patient to continued career   Acne vulgaris Refilled medications he is using but not regularly- encouraged patient to be consistent with Skin Condition The skin condition is well-managed with topical treatment, and the skin is in good condition with current use. Encourage continued use of topical treatment more frequently.      Orders Placed During this Encounter:   Orders Placed This Encounter  Procedures   Miscellaneous Scientist, Clinical (histocompatibility And Immunogenetics).   Meds ordered this encounter  Medications   methylphenidate  (METADATE  CD) 60 MG CR capsule    Sig: Take 1 capsule (60 mg total) by mouth every morning.    Dispense:  30 capsule    Refill:  0   methylphenidate  (METADATE  CD) 60 MG CR capsule    Sig: Take 1 capsule (60 mg total) by mouth every morning.    Dispense:  30 capsule    Refill:  0   methylphenidate  (METADATE  CD) 60 MG CR capsule    Sig: Take 1 capsule (60 mg total) by mouth every morning.    Dispense:  30 capsule    Refill:  0   Adapalene -Benzoyl Peroxide  0.3-2.5 % GEL    Sig: Place 1 Application onto the skin daily at 6 (six) AM.    Dispense:  60 g    Refill:  11   atomoxetine  (STRATTERA ) 10 MG capsule    Sig: Take 1 capsule (10 mg  total) by mouth daily.    Dispense:  90 capsule    Refill:  3       This document was synthesized by artificial intelligence ([employer]bridge) using HIPAA-compliant recording of the clinical interaction;   We discussed the use of AI scribe software for clinical note transcription with the patient, who gave verbal consent to proceed.    Additional Info: This encounter employed state-of-the-art, real-time, collaborative documentation. The patient actively reviewed and assisted in updating their electronic medical record on a shared screen, ensuring transparency and facilitating joint problem-solving for the problem list, overview, and plan. This approach promotes accurate, informed care. The treatment plan was discussed and reviewed in detail, including medication safety, potential side effects, and all patient questions. We confirmed understanding and comfort with the plan. Follow-up instructions were established, including contacting the office for any concerns, returning if symptoms worsen, persist, or new symptoms develop, and precautions for potential emergency department visits.

## 2023-10-05 NOTE — Assessment & Plan Note (Signed)
 Attention-Deficit/Hyperactivity Disorder (ADHD) ADHD is managed with 60 mg extended-release methylphenidate  (Concerta ) and 10 mg amoxapine. Impulse control is adequate, but focus and memory remain insufficient. The current regimen is at the maximum dose for Concerta . There is a potential need for new medication due to these limitations. Risks of switching include potential decompensation, but there may be improved outcomes. Evaluations for autism are ongoing, and GeneSight testing is recommended to tailor medication to the genetic profile. Challenges exist in ordering the GeneSight test due to registration issues. While GeneSight may not assist with ADHD medications, it could be beneficial for depression medications. Prescribe 60 mg extended-release methylphenidate , 30 capsules, for 30 days, to [facility] [address]. Attempt to order GeneSight testing and follow up on its status. Consider alternative medications if GeneSight testing is not feasible or does not provide useful information. Schedule a follow-up appointment in 3 months. Call by the end of the week to update on GeneSight testing status. Call the office next week if there is no update on GeneSight testing.

## 2023-10-05 NOTE — Assessment & Plan Note (Signed)
 Autism spectrum disorder - encouraged patient to continued career

## 2023-10-05 NOTE — Patient Instructions (Signed)
 VISIT SUMMARY:  During your visit, we discussed your current treatment for ADHD and concerns about the effectiveness of your medication for focus and memory. We also reviewed your ongoing evaluations for autism and related conditions, and your therapist's recommendation for genetic testing to tailor your medication. Additionally, we checked on the status of your skin condition, which is well-managed with topical treatment.  YOUR PLAN:  -ATTENTION-DEFICIT/HYPERACTIVITY DISORDER (ADHD): ADHD is a condition characterized by symptoms of inattention, hyperactivity, and impulsivity. Your current medication, 60 mg extended-release methylphenidate  (Concerta ) and 10 mg amoxapine, is managing your impulse control but not adequately addressing your focus and memory. We are considering a change in medication to better support these cognitive functions. We will attempt to order GeneSight testing to tailor your medication based on your genetic profile. If this testing is not feasible or does not provide useful information, we will consider alternative medications. Please continue taking your current medication as prescribed and follow up with us  in 3 months. Call us  by the end of the week to update on the status of the GeneSight testing, and call next week if there is no update.  -SKIN CONDITION: Your skin condition is currently well-managed with the use of topical treatment. Please continue using the topical treatment more frequently to maintain good skin condition.  INSTRUCTIONS:  Please follow up with us  in 3 months for a review of your ADHD treatment. Call us  by the end of the week to update on the status of the GeneSight testing, and call next week if there is no update. Continue using your topical treatment for your skin condition more frequently.  It was a pleasure seeing you today! Your health and satisfaction are our top priorities.  Bernardino Cone, MD  Your Providers PCP: Cone Bernardino MATSU, MD,   [phone]) Referring Provider: Cone Bernardino MATSU, MD,  [phone])  NEXT STEPS: [x]  Early Intervention: Schedule sooner appointment, call our on-call services, or go to emergency room if there is any significant Increase in pain or discomfort New or worsening symptoms Sudden or severe changes in your health [x]  Flexible Follow-Up: We recommend a close follow up for optimal routine care. This allows for progress monitoring and treatment adjustments. [x]  Preventive Care: Schedule your annual preventive care visit! It's typically covered by insurance and helps identify potential health issues early. [x]  Lab & X-ray Appointments: Incomplete tests scheduled today, or call to schedule. X-rays: [facility] (M-F, 8:30am-noon or 1pm-5pm). [x]  Medical Information Release: Sign a release form at front desk to obtain relevant medical information we don't have.  MAKING THE MOST OF OUR FOCUSED 20 MINUTE APPOINTMENTS: [x]   Clearly state your top concerns at the beginning of the visit to focus our discussion [x]   If you anticipate you will need more time, please inform the front desk during scheduling - we can book multiple appointments in the same week. [x]   If you have transportation problems- use our convenient video appointments or ask about transportation support. [x]   We can get down to business faster if you use MyChart to update information before the visit and submit non-urgent questions before your visit. Thank you for taking the time to provide details through MyChart.  Let our nurse know and she can import this information into your encounter documents.  Arrival and Wait Times: [x]   Arriving on time ensures that everyone receives prompt attention. [x]   Early morning (8a) and afternoon (1p) appointments tend to have shortest wait times. [x]   Unfortunately, we cannot delay  appointments for late arrivals or hold slots during phone calls.  Getting Answers and Following Up [x]    Simple Questions & Concerns: For quick questions or basic follow-up after your visit, reach us  at [phone] or MyChart messaging. [x]   Complex Concerns: If your concern is more complex, scheduling an appointment might be best. Discuss this with the staff to find the most suitable option. [x]   Lab & Imaging Results: We'll contact you directly if results are abnormal or you don't use MyChart. Most normal results will be on MyChart within 2-3 business days, with a review message from Dr. Jesus. Haven't heard back in 2 weeks? Need results sooner? Contact us  at [phone]. [x]   Referrals: Our referral coordinator will manage specialist referrals. The specialist's office should contact you within 2 weeks to schedule an appointment. Call us  if you haven't heard from them after 2 weeks.  Staying Connected [x]   MyChart: Activate your MyChart for the fastest way to access results and message us . See the last page of this paperwork for instructions on how to activate.  Bring to Your Next Appointment [x]   Medications: Please bring all your medication bottles to your next appointment to ensure we have an accurate record of your prescriptions. [x]   Health Diaries: If you're monitoring any health conditions at home, keeping a diary of your readings can be very helpful for discussions at your next appointment.  Billing [x]   X-ray & Lab Orders: These are billed by separate companies. Contact the invoicing company directly for questions or concerns. [x]   Visit Charges: Discuss any billing inquiries with our administrative services team.  Your Satisfaction Matters [x]   Share Your Experience: We strive for your satisfaction! If you have any complaints, or preferably compliments, please let Dr. Jesus know directly or contact our Practice Administrators, Manuelita Rubin or [employer], by asking at the front desk.   Reviewing Your Records [x]   Review this early draft of your clinical encounter notes  below and the final encounter summary tomorrow on MyChart after its been completed.  All orders placed so far are visible here: Attention deficit hyperactivity disorder (ADHD), combined type -     Methylphenidate  HCl ER (CD); Take 1 capsule (60 mg total) by mouth every morning.  Dispense: 30 capsule; Refill: 0 -     Methylphenidate  HCl ER (CD); Take 1 capsule (60 mg total) by mouth every morning.  Dispense: 30 capsule; Refill: 0 -     Methylphenidate  HCl ER (CD); Take 1 capsule (60 mg total) by mouth every morning.  Dispense: 30 capsule; Refill: 0  High risk medication use -     Methylphenidate  HCl ER (CD); Take 1 capsule (60 mg total) by mouth every morning.  Dispense: 30 capsule; Refill: 0 -     Methylphenidate  HCl ER (CD); Take 1 capsule (60 mg total) by mouth every morning.  Dispense: 30 capsule; Refill: 0 -     Methylphenidate  HCl ER (CD); Take 1 capsule (60 mg total) by mouth every morning.  Dispense: 30 capsule; Refill: 0  Cognitive and neurobehavioral dysfunction -     Methylphenidate  HCl ER (CD); Take 1 capsule (60 mg total) by mouth every morning.  Dispense: 30 capsule; Refill: 0 -     Methylphenidate  HCl ER (CD); Take 1 capsule (60 mg total) by mouth every morning.  Dispense: 30 capsule; Refill: 0 -     Methylphenidate  HCl ER (CD); Take 1 capsule (60 mg total) by mouth every morning.  Dispense: 30 capsule;  Refill: 0  Attention deficit hyperactivity disorder (ADHD), predominantly inattentive type -     Atomoxetine  HCl; Take 1 capsule (10 mg total) by mouth daily.  Dispense: 90 capsule; Refill: 3  Acne vulgaris -     Adapalene -Benzoyl Peroxide ; Place 1 Application onto the skin daily at 6 (six) AM.  Dispense: 60 g; Refill: 11

## 2023-10-24 ENCOUNTER — Encounter: Payer: Self-pay | Admitting: Internal Medicine

## 2024-01-01 ENCOUNTER — Ambulatory Visit (INDEPENDENT_AMBULATORY_CARE_PROVIDER_SITE_OTHER): Payer: Self-pay | Admitting: Internal Medicine

## 2024-01-01 ENCOUNTER — Encounter: Payer: Self-pay | Admitting: Internal Medicine

## 2024-01-01 VITALS — BP 110/60 | HR 89 | Temp 99.1°F | Ht 70.0 in | Wt 133.8 lb

## 2024-01-01 DIAGNOSIS — F900 Attention-deficit hyperactivity disorder, predominantly inattentive type: Secondary | ICD-10-CM

## 2024-01-01 DIAGNOSIS — Z5181 Encounter for therapeutic drug level monitoring: Secondary | ICD-10-CM

## 2024-01-01 DIAGNOSIS — F902 Attention-deficit hyperactivity disorder, combined type: Secondary | ICD-10-CM

## 2024-01-01 DIAGNOSIS — Z79899 Other long term (current) drug therapy: Secondary | ICD-10-CM | POA: Diagnosis not present

## 2024-01-01 DIAGNOSIS — F09 Unspecified mental disorder due to known physiological condition: Secondary | ICD-10-CM | POA: Diagnosis not present

## 2024-01-01 MED ORDER — ATOMOXETINE HCL 10 MG PO CAPS
10.0000 mg | ORAL_CAPSULE | Freq: Every day | ORAL | 3 refills | Status: DC
Start: 2024-01-01 — End: 2024-04-03

## 2024-01-01 MED ORDER — METHYLPHENIDATE HCL ER (CD) 60 MG PO CPCR: 60.0000 mg | ORAL_CAPSULE | ORAL | 0 refills | Status: DC

## 2024-01-01 NOTE — Assessment & Plan Note (Signed)
 Ordered GeneSight testing. Consider neuropsychiatry re-evaluation.  In my medical opinion, based just on casual observation, he might have autism spectrum disorder but I don't think its ever been officially diagnosed.

## 2024-01-01 NOTE — Progress Notes (Signed)
 ==============================  [facility]: [phone]   -- Medical Office Visit --  Patient: Nathan Hill      Age: 18 y.o.       Sex:  male  Date:   01/01/2024 Today's Healthcare Provider: Anthon Kins, MD  ==============================   Chief Complaint: ADHD  History of Present Illness Since the last visit per patient report: Appetite changes? No Unintentional weight loss? No Is medication working well ? Yes Does patient take drug holidays? No  Difficulties falling to sleep or maintaining sleep? No Any anxiety?  No Any cardiac issues (fainting or paliptations)? No Suicidal thoughts? No Changes in health since last visit? No New medications? No Any illicit substance abuse? No Has the patient taken his medication today? Yes  18 year old male with ADHD and anxiety who presents for a GeneSight test and medication management.  He is currently taking methylphenidate  60 mg and atomoxetine  10 mg daily for ADHD, which he describes as combined type with both hyperactivity and inattentiveness. The medication provides satisfactory duration of effect from around 10 AM until dinner time, approximately 7 PM. No side effects such as appetite changes, weight loss, sleep disturbances, anxiety, cardiac issues, or suicidal thoughts. He occasionally misses doses but does not intentionally take drug holidays.  For anxiety, he has not been on any specific medication. No illicit substance use. No depression, bipolar disorder, PTSD, or hallucinations. He mentions mild OCD characteristics but has not been on medication for it.  Background Reviewed: Problem List: has Attention deficit hyperactivity disorder; Scoliosis; High risk medication use; and Cognitive and neurobehavioral dysfunction on their problem list. Past Medical History:  has a past medical history of Anxiety and Scoliosis (05/13/2022). Past Surgical History:   has no past surgical history  on file. Social History:   reports that he has never smoked. He has never used smokeless tobacco. He reports that he does not drink alcohol and does not use drugs. Family History:  family history includes Cancer in his maternal grandmother and paternal grandmother; Early death in his maternal grandmother and paternal grandmother. Allergies:  has no known allergies.   Medication Reconciliation: Current Outpatient Medications on File Prior to Visit  Medication Sig   Adapalene -Benzoyl Peroxide  0.3-2.5 % GEL Place 1 Application onto the skin daily at 6 (six) AM.   Benzoyl Peroxide  (PANOXYL FOAMING WASH) 10 % LIQD 1 application Externally Once a day   chlorhexidine (PERIDEX) 0.12 % solution SMARTSIG:0.5 Ounce(s) By Mouth Twice Daily   clindamycin (CLINDAGEL) 1 % gel 1 application Externally Pea size amount to whole face in the morning   methylphenidate  (METADATE  CD) 60 MG CR capsule Take 1 capsule (60 mg total) by mouth every morning.   methylphenidate  (METADATE  CD) 60 MG CR capsule Take 1 capsule (60 mg total) by mouth every morning.   methylphenidate  (METADATE  CD) 60 MG CR capsule Take 1 capsule (60 mg total) by mouth every morning.   tretinoin (RETIN-A) 0.025 % cream 1 application in the evening to face Externally Pea size amount to whole face every other night for 30 days   methylphenidate  (METADATE  CD) 60 MG CR capsule Take 1 capsule (60 mg total) by mouth every morning. (Patient not taking: Reported on 01/01/2024)   No current facility-administered medications on file prior to visit.   Medications Discontinued During This Encounter  Medication Reason   methylphenidate  (METADATE  CD) 60 MG CR capsule Reorder   atomoxetine  (STRATTERA ) 10 MG capsule Reorder  Physical Exam:    01/01/2024    1:05 PM 10/04/2023    1:22 PM 07/03/2023    8:10 AM  Vitals with BMI  Height 5\' 10"  5\' 10"  5\' 10"   Weight 133 lbs 13 oz 132 lbs 13 oz 132 lbs  BMI 19.2 19.05 18.94  Systolic 110 118 161  Diastolic 60  75 72  Pulse 89 102 80  Vital signs reviewed.  Nursing notes reviewed. Weight trend reviewed. Physical Exam Exam Context: Evaluation limited by virtual format; however, patient is clearly visualized, cooperative, and engaged throughout. General Appearance: Well-developed, well-nourished; no acute distress by limited video assessment. Pulmonary: No respiratory distress apparent; normal work of breathing observed. Neurological: Patient is awake, alert, and demonstrates no obvious focal neurological deficits or cognitive impairments; sensorium appears unclouded. Psychiatric/Mental Status: Mood is appropriate; demeanor is pleasant, calm, and articulate. Speech is coherent and goal-directed with no evidence of slurred or pressured speech. No abnormal psychomotor activity noted. Substance Misuse Indicators: Pupils appear symmetric and reactive as far as can be assessed via video. No track marks, skin lesions, or other stigmata of substance misuse visible. No signs of intoxication or withdrawal are evident.    No results found for any visits on 01/01/24. Office Visit on 07/03/2023  Component Date Value   Amphetamines, Urine 07/03/2023 Negative    Cannabinoid Quant, Ur 07/03/2023 Negative    Cocaine (Metab.) 07/03/2023 Negative    OPIATE QUANTITATIVE URINE 07/03/2023 Negative    PCP Quant, Ur 07/03/2023 Negative   No image results found. No results found.      10/04/2023    1:31 PM 07/03/2023    8:16 AM 04/27/2023   11:28 AM 11/16/2022   10:48 AM  PHQ 2/9 Scores  PHQ - 2 Score 0 1 1 1   PHQ- 9 Score 2 3 3 2     Assessment & Plan Attention deficit hyperactivity disorder (ADHD), combined type ADHD, combined type, with predominant hyperactivity is managed with methylphenidate  60 mg and atomoxetine  10 mg. The medication is effective until approximately 7 PM, with no issues regarding efficacy or side effects. There are no changes in appetite, weight, or sleep, and no cardiac issues, suicidal thoughts,  or illicit substance use. He occasionally forgets medication but does not intentionally skip doses. Continue methylphenidate  60 mg and atomoxetine  10 mg. Discussed the 90-day mail order prescription option, but will maintain current pharmacy arrangements due to potential mail order issues. Order GeneSight test for potential medication adjustments and follow up with GeneSight for swab instructions and processing. Schedule a 90-day follow-up to reassess medication efficacy and discuss potential changes based on GeneSight results. High risk medication use PDMP reviewed during this encounter. We discussed possibly shifting to 90 days  Cognitive and neurobehavioral dysfunction Ordered GeneSight testing. Consider neuropsychiatry re-evaluation.  In my medical opinion, based just on casual observation, he might have autism spectrum disorder but I don't think its ever been officially diagnosed.      Orders Placed During this Encounter:   Meds ordered this encounter  Medications   atomoxetine  (STRATTERA ) 10 MG capsule    Sig: Take 1 capsule (10 mg total) by mouth daily.    Dispense:  90 capsule    Refill:  3   methylphenidate  (METADATE  CD) 60 MG CR capsule    Sig: Take 1 capsule (60 mg total) by mouth every morning.    Dispense:  30 capsule    Refill:  0   methylphenidate  (METADATE  CD) 60 MG CR capsule  Sig: Take 1 capsule (60 mg total) by mouth every morning.    Dispense:  30 capsule    Refill:  0   methylphenidate  (METADATE  CD) 60 MG CR capsule    Sig: Take 1 capsule (60 mg total) by mouth every morning.    Dispense:  30 capsule    Refill:  0     This document was synthesized by artificial intelligence ([employer]bridge) using HIPAA-compliant recording of the clinical interaction;   We discussed the use of AI scribe software for clinical note transcription with the patient, who gave verbal consent to proceed. additional Info: This encounter employed state-of-the-art, real-time, collaborative  documentation. The patient actively reviewed and assisted in updating their electronic medical record on a shared screen, ensuring transparency and facilitating joint problem-solving for the problem list, overview, and plan. This approach promotes accurate, informed care. The treatment plan was discussed and reviewed in detail, including medication safety, potential side effects, and all patient questions. We confirmed understanding and comfort with the plan. Follow-up instructions were established, including contacting the office for any concerns, returning if symptoms worsen, persist, or new symptoms develop, and precautions for potential emergency department visits.

## 2024-01-01 NOTE — Patient Instructions (Addendum)
 It was a pleasure seeing you today! Your health and satisfaction are our top priorities.  Nathan Curt, MD  Your Providers PCP: Anthon Kins, MD,  [phone]) Referring Provider: Anthon Kins, MD,  [phone])     NEXT STEPS: [x]  Early Intervention: Schedule sooner appointment, call our on-call services, or go to emergency room if there is any significant Increase in pain or discomfort New or worsening symptoms Sudden or severe changes in your health [x]  Flexible Follow-Up: We recommend a Return in about 3 months (around 04/02/2024) for chronic disease monitoring and management. for optimal routine care. This allows for progress monitoring and treatment adjustments. [x]  Preventive Care: Schedule your annual preventive care visit! It's typically covered by insurance and helps identify potential health issues early. [x]  Lab & X-ray Appointments: Incomplete tests scheduled today, or call to schedule. X-rays:  [facility] (M-F, 8:30am-noon or 1pm-5pm). [x]  Medical Information Release: Sign a release form at front desk to obtain relevant medical information we don't have.  MAKING THE MOST OF OUR FOCUSED 20 MINUTE APPOINTMENTS: [x]   Clearly state your top concerns at the beginning of the visit to focus our discussion [x]   If you anticipate you will need more time, please inform the front desk during scheduling - we can book multiple appointments in the same week. [x]   If you have transportation problems- use our convenient video appointments or ask about transportation support. [x]   We can get down to business faster if you use MyChart to update information before the visit and submit non-urgent questions before your visit. Thank you for taking the time to provide details through MyChart.  Let our nurse know and she can import this information into your encounter documents.  Arrival and Wait Times: [x]   Arriving on time ensures that everyone receives prompt  attention. [x]   Early morning (8a) and afternoon (1p) appointments tend to have shortest wait times. [x]   Unfortunately, we cannot delay appointments for late arrivals or hold slots during phone calls.  Getting Answers and Following Up [x]   Simple Questions & Concerns: For quick questions or basic follow-up after your visit, reach us  at [phone] or MyChart messaging. [x]   Complex Concerns: If your concern is more complex, scheduling an appointment might be best. Discuss this with the staff to find the most suitable option. [x]   Lab & Imaging Results: We'll contact you directly if results are abnormal or you don't use MyChart. Most normal results will be on MyChart within 2-3 business days, with a review message from Dr. Boston Byers. Haven't heard back in 2 weeks? Need results sooner? Contact us  at [phone]. [x]   Referrals: Our referral coordinator will manage specialist referrals. The specialist's office should contact you within 2 weeks to schedule an appointment. Call us  if you haven't heard from them after 2 weeks.  Staying Connected [x]   MyChart: Activate your MyChart for the fastest way to access results and message us . See the last page of this paperwork for instructions on how to activate.  Bring to Your Next Appointment [x]   Medications: Please bring all your medication bottles to your next appointment to ensure we have an accurate record of your prescriptions. [x]   Health Diaries: If you're monitoring any health conditions at home, keeping a diary of your readings can be very helpful for discussions at your next appointment.  Billing [x]   X-ray & Lab Orders: These are billed by separate companies. Contact the invoicing company directly for questions or concerns. [x]   Visit  Charges: Discuss any billing inquiries with our administrative services team.  Your Satisfaction Matters [x]   Share Your Experience: We strive for your satisfaction! If you have any complaints, or preferably  compliments, please let Dr. Boston Byers know directly or contact our Practice Administrators, Olinda Bertrand or [employer], by asking at the front desk.   Reviewing Your Records [x]   Review this early draft of your clinical encounter notes below and the final encounter summary tomorrow on MyChart after its been completed.  All orders placed so far are visible here: Attention deficit hyperactivity disorder (ADHD), combined type -     Methylphenidate  HCl ER (CD); Take 1 capsule (60 mg total) by mouth every morning.  Dispense: 30 capsule; Refill: 0 -     Methylphenidate  HCl ER (CD); Take 1 capsule (60 mg total) by mouth every morning.  Dispense: 30 capsule; Refill: 0 -     Methylphenidate  HCl ER (CD); Take 1 capsule (60 mg total) by mouth every morning.  Dispense: 30 capsule; Refill: 0  High risk medication use -     Methylphenidate  HCl ER (CD); Take 1 capsule (60 mg total) by mouth every morning.  Dispense: 30 capsule; Refill: 0 -     Methylphenidate  HCl ER (CD); Take 1 capsule (60 mg total) by mouth every morning.  Dispense: 30 capsule; Refill: 0 -     Methylphenidate  HCl ER (CD); Take 1 capsule (60 mg total) by mouth every morning.  Dispense: 30 capsule; Refill: 0  Cognitive and neurobehavioral dysfunction -     Methylphenidate  HCl ER (CD); Take 1 capsule (60 mg total) by mouth every morning.  Dispense: 30 capsule; Refill: 0 -     Methylphenidate  HCl ER (CD); Take 1 capsule (60 mg total) by mouth every morning.  Dispense: 30 capsule; Refill: 0 -     Methylphenidate  HCl ER (CD); Take 1 capsule (60 mg total) by mouth every morning.  Dispense: 30 capsule; Refill: 0  Attention deficit hyperactivity disorder (ADHD), predominantly inattentive type -     Atomoxetine  HCl; Take 1 capsule (10 mg total) by mouth daily.  Dispense: 90 capsule; Refill: 3  Medication monitoring encounter      Your order for [facility]  Order Medical Necessity Documentation [phone] Customer Service [phone]   Fax [phone] CONFIDENTIAL HEALTHCARE INFORMATION 2024 [employer] (doing business as [employer]) [employer], and associated logos are registered trademarks of [employer], [employer]. and its subsidiaries in the [place]  and other jurisdictions. [facility] Psychotropic ICD-10 code(s) for this patient's psychiatric diagnosis Diagnosis Healthcare Provider Information How does pharmacogenomic testing fit into your treatment plan for this patient? I'm considering augmenting therapy with a new medication or starting/switching to a new medication Treatment Plan The undersigned attests that he/she is licensed to order the selected test(s). I acknowledge that the patient has been provided with information regarding the selected genetic test(s) and obtained  consent for genetic testing from the patient or his/her legal authorized representative. I attest that the selected genetic test(s) are medically necessary and that these results will be used in the medical  management and treatment decisions for the above referenced patient and agree to provide any additional information or documentation to support medical necessity, upon request. Insurers require that you maintain documentation supporting the medical necessity for Gene[facility] tests in the patient's medical record. Please verify that the order information above is correct and include  in your patient's medical record. Psychiatric medications that have failed to work for  this patient (previously or currently prescribed) Failed Medications Have you considered non-genetic factors to make a preliminary drug selection, including a personalized medication decision based on the patient's diagnosis, the patient's other medical  conditions, other medications the patient is taking, professional judgment, clinical science and basic science pertinent to the drug (e.g. mechanism of action, side effects),  the patient's  past medical history and when pertinent, family history and the patient's preferences and values? 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 ? Focalin ( dexmethylphenidate )Vyvanse ( lisdexamfetamine )Zoloft ( sertraline )Strattera  ( atomoxetine  ) Ritalin  ( methylphenidate  ) ? Nathan Hill Nathan Hill 01/01/2024 10:24 AM Patient Name Patient Date of Birth Order Number  [facility]  Order Medical Necessity Documentation [phone] Customer Service [phone]  Fax [phone] CONFIDENTIAL HEALTHCARE INFORMATION 2024 [employer] (doing business as [employer] [facility]) [employer], [employer], and associated logos are registered trademarks of [employer], [employer] and its subsidiaries in the [place]  and other jurisdictions. 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

## 2024-01-01 NOTE — Assessment & Plan Note (Signed)
 PDMP reviewed during this encounter. We discussed possibly shifting to 90 days

## 2024-01-01 NOTE — Assessment & Plan Note (Signed)
 ADHD, combined type, with predominant hyperactivity is managed with methylphenidate  60 mg and atomoxetine  10 mg. The medication is effective until approximately 7 PM, with no issues regarding efficacy or side effects. There are no changes in appetite, weight, or sleep, and no cardiac issues, suicidal thoughts, or illicit substance use. He occasionally forgets medication but does not intentionally skip doses. Continue methylphenidate  60 mg and atomoxetine  10 mg. Discussed the 90-day mail order prescription option, but will maintain current pharmacy arrangements due to potential mail order issues. Order GeneSight test for potential medication adjustments and follow up with [facility] for swab instructions and processing. Schedule a 90-day follow-up to reassess medication efficacy and discuss potential changes based on [facility] results.

## 2024-04-03 ENCOUNTER — Encounter: Payer: Self-pay | Admitting: Internal Medicine

## 2024-04-03 ENCOUNTER — Ambulatory Visit (INDEPENDENT_AMBULATORY_CARE_PROVIDER_SITE_OTHER): Admitting: Internal Medicine

## 2024-04-03 VITALS — BP 110/66 | HR 69 | Temp 98.0°F | Ht 70.0 in | Wt 145.8 lb

## 2024-04-03 DIAGNOSIS — F09 Unspecified mental disorder due to known physiological condition: Secondary | ICD-10-CM | POA: Diagnosis not present

## 2024-04-03 DIAGNOSIS — Z79899 Other long term (current) drug therapy: Secondary | ICD-10-CM

## 2024-04-03 DIAGNOSIS — F902 Attention-deficit hyperactivity disorder, combined type: Secondary | ICD-10-CM

## 2024-04-03 DIAGNOSIS — F900 Attention-deficit hyperactivity disorder, predominantly inattentive type: Secondary | ICD-10-CM

## 2024-04-03 DIAGNOSIS — R635 Abnormal weight gain: Secondary | ICD-10-CM

## 2024-04-03 MED ORDER — ATOMOXETINE HCL 10 MG PO CAPS: 10.0000 mg | ORAL_CAPSULE | Freq: Every day | ORAL | 3 refills | Status: DC

## 2024-04-03 MED ORDER — METHYLPHENIDATE HCL ER (CD) 60 MG PO CPCR: 60.0000 mg | ORAL_CAPSULE | ORAL | 0 refills | Status: AC

## 2024-04-03 NOTE — Patient Instructions (Addendum)
 It was a pleasure seeing you today! Your health and satisfaction are our top priorities.  Nathan Cone, MD  VISIT SUMMARY: You came in today for a follow-up visit to discuss your ADHD management. Your symptoms are stable with your current medications, Metadate  CD and Strattera . You also mentioned a recent weight gain, which you attribute to increased eating with family during the summer holidays. No other significant health issues were reported.  YOUR PLAN: -ATTENTION-DEFICIT HYPERACTIVITY DISORDER, COMBINED TYPE: ADHD is a condition that affects your ability to focus and control impulses. Your symptoms are well-managed with Metadate  CD 60 mg and Strattera  10 mg. We will continue with your current medications and provide a three-month supply. [facility] testing has been ordered to assess the effectiveness of your medications, and we will follow up on the results. If you start college and find that your current medication is not sufficient, please return sooner for adjustments.  -ABNORMAL WEIGHT GAIN: You have gained about 10 pounds over the past three months, which you believe is due to increased eating with family. There are no significant concerns at this time, and you do not wish to pursue weight loss interventions. We will monitor your weight and encourage physical activity to ensure that any weight gain is muscle rather than fat.  INSTRUCTIONS: Please follow up on the GeneSight test results to assess the effectiveness of your ADHD medications. If you start college and find that your current medication is not sufficient, return sooner for adjustments.  Your Providers PCP: [facility] Nathan MATSU, MD,  [phone]) Referring Provider: [facility] Nathan MATSU, MD,  [phone])  NEXT STEPS: [x]  Early Intervention: Schedule sooner appointment, call our on-call services, or go to emergency room if there is any significant Increase in pain or discomfort New or worsening symptoms Sudden or severe changes in  your health [x]  Flexible Follow-Up: We recommend a Return in about 3 months (around 07/04/2024). for optimal routine care. This allows for progress monitoring and treatment adjustments. [x]  Preventive Care: Schedule your annual preventive care visit! It's typically covered by insurance and helps identify potential health issues early. [x]  Lab & X-ray Appointments: Incomplete tests scheduled today, or call to schedule. X-rays: [facility] (M-F, 8:30am-noon or 1pm-5pm). [x]  Medical Information Release: Sign a release form at front desk to obtain relevant medical information we don't have.  MAKING THE MOST OF OUR FOCUSED 20 MINUTE APPOINTMENTS: [x]   Clearly state your top concerns at the beginning of the visit to focus our discussion [x]   If you anticipate you will need more time, please inform the front desk during scheduling - we can book multiple appointments in the same week. [x]   If you have transportation problems- use our convenient video appointments or ask about transportation support. [x]   We can get down to business faster if you use MyChart to update information before the visit and submit non-urgent questions before your visit. Thank you for taking the time to provide details through MyChart.  Let our nurse know and she can import this information into your encounter documents.  Arrival and Wait Times: [x]   Arriving on time ensures that everyone receives prompt attention. [x]   Early morning (8a) and afternoon (1p) appointments tend to have shortest wait times. [x]   Unfortunately, we cannot delay appointments for late arrivals or hold slots during phone calls.  Getting Answers and Following Up [x]   Simple Questions & Concerns: For quick questions or basic follow-up after your visit, reach us  at [phone] or MyChart messaging. [  x]  Complex Concerns: If your concern is more complex, scheduling an appointment might be best. Discuss this with the staff to find the most  suitable option. [x]   Lab & Imaging Results: We'll contact you directly if results are abnormal or you don't use MyChart. Most normal results will be on MyChart within 2-3 business days, with a review message from Dr. Jesus. Haven't heard back in 2 weeks? Need results sooner? Contact us  at [phone]. [x]   Referrals: Our referral coordinator will manage specialist referrals. The specialist's office should contact you within 2 weeks to schedule an appointment. Call us  if you haven't heard from them after 2 weeks.  Staying Connected [x]   MyChart: Activate your MyChart for the fastest way to access results and message us . See the last page of this paperwork for instructions on how to activate.  Bring to Your Next Appointment [x]   Medications: Please bring all your medication bottles to your next appointment to ensure we have an accurate record of your prescriptions. [x]   Health Diaries: If you're monitoring any health conditions at home, keeping a diary of your readings can be very helpful for discussions at your next appointment.  Billing [x]   X-ray & Lab Orders: These are billed by separate companies. Contact the invoicing company directly for questions or concerns. [x]   Visit Charges: Discuss any billing inquiries with our administrative services team.  Your Satisfaction Matters [x]   Share Your Experience: We strive for your satisfaction! If you have any complaints, or preferably compliments, please let Dr. Jesus know directly or contact our Practice Administrators, Manuelita Rubin or [employer], by asking at the front desk.   Reviewing Your Records [x]   Review this early draft of your clinical encounter notes below and the final encounter summary tomorrow on MyChart after its been completed.  All orders placed so far are visible here: Abnormal weight gain  Attention deficit hyperactivity disorder (ADHD), combined type -     Methylphenidate  HCl ER (CD); Take 1 capsule (60 mg total)  by mouth every morning.  Dispense: 30 capsule; Refill: 0 -     Methylphenidate  HCl ER (CD); Take 1 capsule (60 mg total) by mouth every morning.  Dispense: 30 capsule; Refill: 0 -     Methylphenidate  HCl ER (CD); Take 1 capsule (60 mg total) by mouth every morning.  Dispense: 30 capsule; Refill: 0  Attention deficit hyperactivity disorder (ADHD), predominantly inattentive type -     Methylphenidate  HCl ER (CD); Take 1 capsule (60 mg total) by mouth every morning.  Dispense: 30 capsule; Refill: 0 -     Atomoxetine  HCl; Take 1 capsule (10 mg total) by mouth daily.  Dispense: 90 capsule; Refill: 3  High risk medication use -     Methylphenidate  HCl ER (CD); Take 1 capsule (60 mg total) by mouth every morning.  Dispense: 30 capsule; Refill: 0 -     Methylphenidate  HCl ER (CD); Take 1 capsule (60 mg total) by mouth every morning.  Dispense: 30 capsule; Refill: 0  Cognitive and neurobehavioral dysfunction -     Methylphenidate  HCl ER (CD); Take 1 capsule (60 mg total) by mouth every morning.  Dispense: 30 capsule; Refill: 0 -     Methylphenidate  HCl ER (CD); Take 1 capsule (60 mg total) by mouth every morning.  Dispense: 30 capsule; Refill: 0

## 2024-04-03 NOTE — Progress Notes (Signed)
 ==============================  [facility]: [phone]   -- Medical Office Visit --  Patient: Nathan Hill      Age: 18 y.o.       Sex:  male  Date:   04/03/2024 Today's Healthcare Provider: Bernardino KANDICE Cone, MD  ==============================   Chief Complaint: ADHD  Discussed the use of AI scribe software for clinical note transcription with the patient, who gave verbal consent to proceed.  History of Present Illness  18 year old male who presents for a follow-up visit regarding ADHD management.  His ADHD symptoms are stable. He takes Metadate  CD 60 mg mostly every day but sometimes forgets. The medication is effective from morning until he returns home from work. He also takes Strattera  10 mg, which he feels is working well. He works at [employer], where his employer wants him to work faster. He is considering attending [employer] for a career in editing and media.  He averages six to eight hours of sleep per night using a sleep app to track his sleep. He notes snoring when sleeping on his back but primarily sleeps on his side. No sleeping problems reported.  He mentions a weight gain of approximately ten pounds over the past three months, attributing it to increased eating with family during summer holidays and his brother being home.  No mental health issues such as anxiety, stress, depression, or suicidality. No heart issues such as skipping or jumping beats, and he has not experienced passing out. Since the last visit has the patient had any:  Appetite changes?  Yes, eating with family more Unintentional weight loss? 10 pounds weight gain Is medication working well ? Yes Does patient take drug holidays? No Difficulties falling to sleep or maintaining sleep? No getting 6 hours, no snoring if sleeps on side. Any anxiety?  No Any cardiac issues (fainting or palpitations)? No Suicidal thoughts? No Changes in health since  last visit? No New medications? No Any illicit substance abuse? No Has the patient taken his medication today? Yes   Wt Readings from Last 10 Encounters:  04/03/24 145 lb 12.8 oz (66.1 kg)  01/01/24 133 lb 12.8 oz (60.7 kg)  10/04/23 132 lb 12.8 oz (60.2 kg)  07/03/23 132 lb (59.9 kg) (14%, Z= -1.09)*  04/27/23 129 lb 9.6 oz (58.8 kg) (12%, Z= -1.20)*  02/06/23 131 lb (59.4 kg) (14%, Z= -1.09)*  11/16/22 135 lb 12.8 oz (61.6 kg) (21%, Z= -0.79)*  08/12/22 135 lb 12.8 oz (61.6 kg) (23%, Z= -0.75)*  05/13/22 136 lb 12.8 oz (62.1 kg) (26%, Z= -0.65)*   * Growth percentiles are based on CDC (Boys, 2-20 Years) data.    Background Reviewed: Problem List: has Attention deficit hyperactivity disorder; Scoliosis; High risk medication use; and Cognitive and neurobehavioral dysfunction on their problem list. Past Medical History:  has a past medical history of Anxiety and Scoliosis (05/13/2022). Past Surgical History:   has no past surgical history on file. Social History:   reports that he has never smoked. He has never used smokeless tobacco. He reports that he does not drink alcohol and does not use drugs. Family History:  family history includes Cancer in his maternal grandmother and paternal grandmother; Early death in his maternal grandmother and paternal grandmother. Allergies:  has no known allergies.   Medication Reconciliation: Current Outpatient Medications on File Prior to Visit  Medication Sig   Adapalene -Benzoyl Peroxide  0.3-2.5 % GEL Place 1 Application onto the skin daily at  6 (six) AM.   Benzoyl Peroxide  (PANOXYL FOAMING WASH) 10 % LIQD 1 application Externally Once a day   chlorhexidine (PERIDEX) 0.12 % solution SMARTSIG:0.5 Ounce(s) By Mouth Twice Daily   clindamycin (CLINDAGEL) 1 % gel 1 application Externally Pea size amount to whole face in the morning   methylphenidate  (METADATE  CD) 60 MG CR capsule Take 1 capsule (60 mg total) by mouth every morning.   methylphenidate   (METADATE  CD) 60 MG CR capsule Take 1 capsule (60 mg total) by mouth every morning.   methylphenidate  (METADATE  CD) 60 MG CR capsule Take 1 capsule (60 mg total) by mouth every morning.   methylphenidate  (METADATE  CD) 60 MG CR capsule Take 1 capsule (60 mg total) by mouth every morning.   tretinoin (RETIN-A) 0.025 % cream 1 application in the evening to face Externally Pea size amount to whole face every other night for 30 days   No current facility-administered medications on file prior to visit.   Medications Discontinued During This Encounter  Medication Reason   methylphenidate  (METADATE  CD) 60 MG CR capsule Reorder   methylphenidate  (METADATE  CD) 60 MG CR capsule Reorder   methylphenidate  (METADATE  CD) 60 MG CR capsule Reorder   atomoxetine  (STRATTERA ) 10 MG capsule Reorder     Physical Exam:    04/03/2024    1:37 PM 01/01/2024    1:05 PM 10/04/2023    1:22 PM  Vitals with BMI  Height 5' 10 5' 10 5' 10  Weight 145 lbs 13 oz 133 lbs 13 oz 132 lbs 13 oz  BMI 20.92 19.2 19.05  Systolic 110 110 881  Diastolic 66 60 75  Pulse 69 89 102  Vital signs reviewed.  Nursing notes reviewed. Weight trend reviewed. Physical Exam General Appearance: Patient is well-developed, well-nourished, and in no acute distress. Gait and posture appear normal. Pulmonary: Respirations are unlabored; no wheezing, rales, or other abnormal breath sounds noted on auscultation. Neurological: Patient is awake, alert, and oriented to person, place, and time. No focal neurological deficits detected on screening exam. Coordination, muscle strength, and sensation are intact. Psychiatric/Mental Status: Patient's mood is appropriate with a pleasant, calm demeanor. Speech is clear, coherent, and goal-directed. No observable signs of acute psychosis, mania, or significant anxiety. Substance Misuse Indicators: Pupils are equal, round, and reactive to light. No track marks, skin lesions, or other visible stigmata of  substance misuse. Behavior is cooperative and consistent with stable ADHD management, with no signs suggestive of intoxication or withdrawal.  Patient has faint, hardly discernible cognitive and neurobehavioral dysfunction consistent with autism spectrum disorder(s)   Results:    10/04/2023    1:31 PM 07/03/2023    8:16 AM 04/27/2023   11:28 AM 11/16/2022   10:48 AM  PHQ 2/9 Scores  PHQ - 2 Score 0 1 1 1   PHQ- 9 Score 2 3 3 2    Results     No results found for any visits on 04/03/24. Office Visit on 07/03/2023  Component Date Value Ref Range Status   Amphetamines, Urine 07/03/2023 Negative  Cutoff=1000 ng/mL Final   Cannabinoid Quant, Ur 07/03/2023 Negative  Cutoff=50 ng/mL Final   Cocaine (Metab.) 07/03/2023 Negative  Cutoff=300 ng/mL Final   OPIATE QUANTITATIVE URINE 07/03/2023 Negative  Cutoff=2000 ng/mL Final   PCP Quant, Ur 07/03/2023 Negative  Cutoff=25 ng/mL Final  Lab on 08/18/2022  Component Date Value Ref Range Status   WBC 08/18/2022 7.2  4.5 - 13.5 K/uL Final   RBC 08/18/2022 5.53  3.80 - 5.70  Mil/uL Final   Hemoglobin 08/18/2022 15.8  12.0 - 16.0 g/dL Final   HCT 87/78/7976 47.2  36.0 - 49.0 % Final   MCV 08/18/2022 85.3  78.0 - 98.0 fl Final   MCHC 08/18/2022 33.6  31.0 - 37.0 g/dL Final   RDW 87/78/7976 13.2  11.4 - 15.5 % Final   Platelets 08/18/2022 274.0  150.0 - 575.0 K/uL Final   Neutrophils Relative % 08/18/2022 41.0 (L)  43.0 - 71.0 % Final   Lymphocytes Relative 08/18/2022 38.5  24.0 - 48.0 % Final   Monocytes Relative 08/18/2022 9.9  3.0 - 12.0 % Final   Eosinophils Relative 08/18/2022 9.9 (H)  0.0 - 5.0 % Final   Basophils Relative 08/18/2022 0.7  0.0 - 3.0 % Final   Neutro Abs 08/18/2022 2.9  1.4 - 7.7 K/uL Final   Lymphs Abs 08/18/2022 2.8  0.7 - 4.0 K/uL Final   Monocytes Absolute 08/18/2022 0.7  0.1 - 1.0 K/uL Final   Eosinophils Absolute 08/18/2022 0.7  0.0 - 0.7 K/uL Final   Basophils Absolute 08/18/2022 0.1  0.0 - 0.1 K/uL Final   Sodium  08/18/2022 140  135 - 145 mEq/L Final   Potassium 08/18/2022 4.0  3.5 - 5.1 mEq/L Final   Chloride 08/18/2022 102  96 - 112 mEq/L Final   CO2 08/18/2022 31  19 - 32 mEq/L Final   Glucose, Bld 08/18/2022 83  70 - 99 mg/dL Final   BUN 87/78/7976 10  6 - 23 mg/dL Final   Creatinine, Ser 08/18/2022 1.01  0.40 - 1.50 mg/dL Final   Total Bilirubin 08/18/2022 0.4  0.3 - 1.2 mg/dL Final   Alkaline Phosphatase 08/18/2022 80  52 - 171 U/L Final   AST 08/18/2022 15  0 - 37 U/L Final   ALT 08/18/2022 12  0 - 53 U/L Final   Total Protein 08/18/2022 7.1  6.0 - 8.3 g/dL Final   Albumin 87/78/7976 4.6  3.5 - 5.2 g/dL Final   GFR 87/78/7976 108.25  >60.00 mL/min Final   Calcium 08/18/2022 9.8  8.4 - 10.5 mg/dL Final   Cholesterol 87/78/7976 148  0 - 200 mg/dL Final   Triglycerides 87/78/7976 95.0  0.0 - 149.0 mg/dL Final   HDL 87/78/7976 52.20  >39.00 mg/dL Final   VLDL 87/78/7976 19.0  0.0 - 40.0 mg/dL Final   LDL Cholesterol 08/18/2022 77  0 - 99 mg/dL Final   Total CHOL/HDL Ratio 08/18/2022 3   Final   NonHDL 08/18/2022 96.06   Final  Office Visit on 05/13/2022  Component Date Value Ref Range Status   Alcohol Metabolites 05/13/2022 NEGATIVE  <500 ng/mL Final   Amphetamines 05/13/2022 NEGATIVE  <500 ng/mL Final   Benzodiazepines 05/13/2022 NEGATIVE  <100 ng/mL Final   Buprenorphine, Urine 05/13/2022 NEGATIVE  <5 ng/mL Final   Cocaine Metabolite 05/13/2022 NEGATIVE  <150 ng/mL Final   6 Acetylmorphine 05/13/2022 NEGATIVE  <10 ng/mL Final   Marijuana Metabolite 05/13/2022 NEGATIVE  <20 ng/mL Final   MDMA 05/13/2022 NEGATIVE  <500 ng/mL Final   Opiates 05/13/2022 NEGATIVE  <100 ng/mL Final   Oxycodone 05/13/2022 NEGATIVE  <100 ng/mL Final   Creatinine 05/13/2022 105.6  > or = 20.0 mg/dL Final   pH 90/84/7976 7.3  4.5 - 9.0 Final   Oxidant 05/13/2022 NEGATIVE  <200 mcg/mL Final   Notes and Comments 05/13/2022    Final  No image results found. No results found.       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 He reports effectiveness for work shifts but is concerned about medication duration if starting college. GeneSight testing has been ordered to assess medication effectiveness, with results pending due to insurance issues. A more expensive ADHD medication may be needed if current treatment does not meet work and school demands. Continue current medications and provide a three-month supply. Follow up on GeneSight test results and adjust the regimen if necessary. Advise returning sooner if college requires medication adjustments. Abnormal weight gain He has gained approximately 10 pounds over the past three months, attributed to increased eating with family during summer holidays. No significant concerns are expressed, and he does not wish to pursue weight loss interventions at this time. Monitor weight and consider lifestyle modifications if weight gain continues. Encourage physical activity to ensure weight gain is muscle rather than fat.  ORDER ASSOCIATIONS  #   DIAGNOSIS / CONDITION ICD-10 ENCOUNTER ORDER     ICD-10-CM   1. Abnormal weight gain  R63.5     2. Attention deficit hyperactivity disorder (ADHD), combined type  F90.2 methylphenidate  (METADATE  CD) 60 MG CR capsule    methylphenidate  (METADATE  CD) 60 MG CR capsule    methylphenidate  (METADATE  CD) 60 MG CR capsule    3. Attention deficit hyperactivity disorder (ADHD), predominantly inattentive type  F90.0 methylphenidate  (METADATE  CD) 60 MG CR capsule    atomoxetine  (STRATTERA ) 10 MG capsule    4. High risk medication use  Z79.899 methylphenidate  (METADATE  CD) 60 MG CR capsule     methylphenidate  (METADATE  CD) 60 MG CR capsule    5. Cognitive and neurobehavioral dysfunction  F09 methylphenidate  (METADATE  CD) 60 MG CR capsule    methylphenidate  (METADATE  CD) 60 MG CR capsule     Meds ordered this encounter  Medications   methylphenidate  (METADATE  CD) 60 MG CR capsule    Sig: Take 1 capsule (60 mg total) by mouth every morning.    Dispense:  30 capsule    Refill:  0    Fill when due.   methylphenidate  (METADATE  CD) 60 MG CR capsule    Sig: Take 1 capsule (60 mg total) by mouth every morning.    Dispense:  30 capsule    Refill:  0   methylphenidate  (METADATE  CD) 60 MG CR capsule    Sig: Take 1 capsule (60 mg total) by mouth every morning.    Dispense:  30 capsule    Refill:  0   atomoxetine  (STRATTERA ) 10 MG capsule    Sig: Take 1 capsule (10 mg total) by mouth daily.    Dispense:  90 capsule    Refill:  3      This document was synthesized by artificial intelligence ([employer]bridge) using HIPAA-compliant recording of the clinical interaction;   We discussed the use of AI scribe software for clinical note transcription with the patient, who gave verbal consent to proceed. additional Info: This encounter employed state-of-the-art, real-time, collaborative documentation. The patient actively reviewed and assisted in updating their electronic medical record on a shared screen, ensuring transparency and facilitating joint problem-solving for the problem list, overview, and plan. This approach promotes accurate, informed care. The treatment plan was discussed and reviewed in detail, including medication safety, potential side effects, and all patient questions.  We confirmed understanding and comfort with the plan. Follow-up instructions were established, including contacting the office for any concerns, returning if symptoms worsen, persist, or new symptoms develop, and precautions for potential emergency department visits.

## 2024-04-03 NOTE — Assessment & Plan Note (Signed)
 Attention-deficit hyperactivity disorder, combined type   ADHD symptoms are well-managed with Metadate  CD 60 mg and Strattera  10 mg. He reports effectiveness for work shifts but is concerned about medication duration if starting college. GeneSight testing has been ordered to assess medication effectiveness, with results pending due to insurance issues. A more expensive ADHD medication may be needed if current treatment does not meet work and school demands. Continue current medications and provide a three-month supply. Follow up on GeneSight test results and adjust the regimen if necessary. Advise returning sooner if college requires medication adjustments.

## 2024-05-29 ENCOUNTER — Encounter: Payer: Self-pay | Admitting: Internal Medicine

## 2024-07-04 ENCOUNTER — Ambulatory Visit (INDEPENDENT_AMBULATORY_CARE_PROVIDER_SITE_OTHER): Admitting: Internal Medicine

## 2024-07-04 ENCOUNTER — Encounter: Payer: Self-pay | Admitting: Internal Medicine

## 2024-07-04 VITALS — BP 110/72 | HR 94 | Temp 98.6°F | Ht 70.0 in | Wt 149.4 lb

## 2024-07-04 DIAGNOSIS — F902 Attention-deficit hyperactivity disorder, combined type: Secondary | ICD-10-CM | POA: Diagnosis not present

## 2024-07-04 DIAGNOSIS — F09 Unspecified mental disorder due to known physiological condition: Secondary | ICD-10-CM

## 2024-07-04 DIAGNOSIS — Z79899 Other long term (current) drug therapy: Secondary | ICD-10-CM

## 2024-07-04 DIAGNOSIS — F900 Attention-deficit hyperactivity disorder, predominantly inattentive type: Secondary | ICD-10-CM

## 2024-07-04 MED ORDER — GUANFACINE HCL ER 1 MG PO TB24: 1.0000 mg | ORAL_TABLET | Freq: Every day | ORAL | 0 refills | Status: DC

## 2024-07-04 MED ORDER — METHYLPHENIDATE HCL ER (CD) 60 MG PO CPCR: 60.0000 mg | ORAL_CAPSULE | ORAL | 0 refills | Status: AC

## 2024-07-04 MED ORDER — METHYLPHENIDATE HCL ER (CD) 60 MG PO CPCR
60.0000 mg | ORAL_CAPSULE | ORAL | 0 refills | Status: AC
Start: 2024-09-02 — End: ?

## 2024-07-04 MED ORDER — METHYLPHENIDATE HCL ER (CD) 60 MG PO CPCR: 60.0000 mg | ORAL_CAPSULE | ORAL | 0 refills | Status: DC

## 2024-07-04 NOTE — Patient Instructions (Addendum)
 It was a pleasure seeing you today! Your health and satisfaction are our top priorities.  Bernardino Cone, MD  VISIT SUMMARY: You came in today for a follow-up on your ADHD medication management. We discussed the current effectiveness of your medication and reviewed the results of your recent gene site test. You are currently taking methylphenidate  and a low dose of Strattera , but the gene site test indicated that these medications may not be the best options for you due to genetic factors. We talked about alternative medications and the next steps for your treatment plan.  YOUR PLAN: -ATTENTION-DEFICIT HYPERACTIVITY DISORDER (ADHD), COMBINED TYPE: ADHD is a condition characterized by symptoms of inattention, hyperactivity, and impulsivity. Your current medication, methylphenidate , is not working as well as we would like due to genetic factors. We discussed several alternative medications, including Adderall, Vyvanse, Jornay, Azstarys, Qelbree, and Xelstra. You received an informational handout on these options. We also prescribed guanfacine to help with any jitteriness and anxiety caused by methylphenidate . You should talk to your family and therapist about these options and contact your insurance company to check coverage for the alternatives. You have a seven-day window to decide on switching medications before your next dose of methylphenidate  is due.  INSTRUCTIONS: Please discuss the alternative medication options with your family and therapist. Contact your insurance company to verify coverage for the alternative medications we discussed. You have seven days to decide on switching medications before your next dose of methylphenidate  is due. If you have any questions or need further assistance, please reach out to our office.  Your Providers PCP: Cone Bernardino MATSU, MD,  [phone]) Referring Provider: Cone Bernardino MATSU, MD,  [phone])  NEXT STEPS: [x]  Early Intervention: Schedule sooner  appointment, call our on-call services, or go to emergency room if there is any significant Increase in pain or discomfort New or worsening symptoms Sudden or severe changes in your health [x]  Flexible Follow-Up: We recommend a Return in about 3 months (around 10/04/2024). for optimal routine care. This allows for progress monitoring and treatment adjustments. [x]  Preventive Care: Schedule your annual preventive care visit! It's typically covered by insurance and helps identify potential health issues early. [x]  Lab & X-ray Appointments: Incomplete tests scheduled today, or call to schedule. X-rays: [facility] (M-F, 8:30am-noon or 1pm-5pm). [x]  Medical Information Release: Sign a release form at front desk to obtain relevant medical information we don't have.  MAKING THE MOST OF OUR FOCUSED 20 MINUTE APPOINTMENTS: [x]   Clearly state your top concerns at the beginning of the visit to focus our discussion [x]   If you anticipate you will need more time, please inform the front desk during scheduling - we can book multiple appointments in the same week. [x]   If you have transportation problems- use our convenient video appointments or ask about transportation support. [x]   We can get down to business faster if you use MyChart to update information before the visit and submit non-urgent questions before your visit. Thank you for taking the time to provide details through MyChart.  Let our nurse know and she can import this information into your encounter documents.  Arrival and Wait Times: [x]   Arriving on time ensures that everyone receives prompt attention. [x]   Early morning (8a) and afternoon (1p) appointments tend to have shortest wait times. [x]   Unfortunately, we cannot delay appointments for late arrivals or hold slots during phone calls.  Getting Answers and Following Up [x]   Simple Questions & Concerns: For quick questions or  basic follow-up after your visit, reach us  at  [phone] or MyChart messaging. [x]   Complex Concerns: If your concern is more complex, scheduling an appointment might be best. Discuss this with the staff to find the most suitable option. [x]   Lab & Imaging Results: We'll contact you directly if results are abnormal or you don't use MyChart. Most normal results will be on MyChart within 2-3 business days, with a review message from Dr. Jesus. Haven't heard back in 2 weeks? Need results sooner? Contact us  at [phone]. [x]   Referrals: Our referral coordinator will manage specialist referrals. The specialist's office should contact you within 2 weeks to schedule an appointment. Call us  if you haven't heard from them after 2 weeks.  Staying Connected [x]   MyChart: Activate your MyChart for the fastest way to access results and message us . See the last page of this paperwork for instructions on how to activate.  Bring to Your Next Appointment [x]   Medications: Please bring all your medication bottles to your next appointment to ensure we have an accurate record of your prescriptions. [x]   Health Diaries: If you're monitoring any health conditions at home, keeping a diary of your readings can be very helpful for discussions at your next appointment.  Billing [x]   X-ray & Lab Orders: These are billed by separate companies. Contact the invoicing company directly for questions or concerns. [x]   Visit Charges: Discuss any billing inquiries with our administrative services team.  Your Satisfaction Matters [x]   Share Your Experience: We strive for your satisfaction! If you have any complaints, or preferably compliments, please let Dr. Jesus know directly or contact our Practice Administrators, Manuelita Rubin or [employer], by asking at the front desk.   Reviewing Your Records [x]   Review this early draft of your clinical encounter notes below and the final encounter summary tomorrow on MyChart after its been completed.  All orders  placed so far are visible here: Attention deficit hyperactivity disorder (ADHD), combined type Assessment & Plan: Attention-deficit hyperactivity disorder, combined type   Methylphenidate  shows reduced effectiveness due to genetic factors, with gene-drug interaction results indicating decreased efficacy for both methylphenidate  and dexmethylphenidate. Strattera  is contraindicated due to potential accumulation. Guanfacine is prescribed to alleviate jitteriness and anxiety from methylphenidate . Alternative medications such as Adderall and Vyvanse are considered, with insurance coverage being crucial. Newer options like Elissa Borders, Monette, and Xelstra are also potential choices. A three-month supply of methylphenidate  is provided. He received an informational handout on alternative medications and is advised to discuss options with family and therapist. He should contact the insurance company to verify coverage for alternatives. A seven-day window is given to decide on switching medications before the next methylphenidate  dose is due. PDMP reviewed during this encounter.   Orders: -     guanFACINE HCl ER; Take 1 tablet (1 mg total) by mouth daily.  Dispense: 30 tablet; Refill: 0 -     Methylphenidate  HCl ER (CD); Take 1 capsule (60 mg total) by mouth every morning.  Dispense: 30 capsule; Refill: 0 -     Methylphenidate  HCl ER (CD); Take 1 capsule (60 mg total) by mouth every morning.  Dispense: 30 capsule; Refill: 0 -     Methylphenidate  HCl ER (CD); Take 1 capsule (60 mg total) by mouth every morning.  Dispense: 30 capsule; Refill: 0  Cognitive and neurobehavioral dysfunction Assessment & Plan: Attention-deficit hyperactivity disorder, combined type   Methylphenidate  shows reduced effectiveness due to genetic factors, with gene-drug interaction results  indicating decreased efficacy for both methylphenidate  and dexmethylphenidate. Strattera  is contraindicated due to potential accumulation.  Guanfacine is prescribed to alleviate jitteriness and anxiety from methylphenidate . Alternative medications such as Adderall and Vyvanse are considered, with insurance coverage being crucial. Newer options like Elissa Borders, Monette, and Xelstra are also potential choices. A three-month supply of methylphenidate  is provided. He received an informational handout on alternative medications and is advised to discuss options with family and therapist. He should contact the insurance company to verify coverage for alternatives. A seven-day window is given to decide on switching medications before the next methylphenidate  dose is due. PDMP reviewed during this encounter.   Orders: -     Methylphenidate  HCl ER (CD); Take 1 capsule (60 mg total) by mouth every morning.  Dispense: 30 capsule; Refill: 0 -     Methylphenidate  HCl ER (CD); Take 1 capsule (60 mg total) by mouth every morning.  Dispense: 30 capsule; Refill: 0 -     Methylphenidate  HCl ER (CD); Take 1 capsule (60 mg total) by mouth every morning.  Dispense: 30 capsule; Refill: 0  High risk medication use Assessment & Plan: Attention-deficit hyperactivity disorder, combined type   Methylphenidate  shows reduced effectiveness due to genetic factors, with gene-drug interaction results indicating decreased efficacy for both methylphenidate  and dexmethylphenidate. Strattera  is contraindicated due to potential accumulation. Guanfacine is prescribed to alleviate jitteriness and anxiety from methylphenidate . Alternative medications such as Adderall and Vyvanse are considered, with insurance coverage being crucial. Newer options like Elissa Borders, Monette, and Xelstra are also potential choices. A three-month supply of methylphenidate  is provided. He received an informational handout on alternative medications and is advised to discuss options with family and therapist. He should contact the insurance company to verify coverage for alternatives. A  seven-day window is given to decide on switching medications before the next methylphenidate  dose is due. PDMP reviewed during this encounter.   Orders: -     Methylphenidate  HCl ER (CD); Take 1 capsule (60 mg total) by mouth every morning.  Dispense: 30 capsule; Refill: 0 -     Methylphenidate  HCl ER (CD); Take 1 capsule (60 mg total) by mouth every morning.  Dispense: 30 capsule; Refill: 0 -     Methylphenidate  HCl ER (CD); Take 1 capsule (60 mg total) by mouth every morning.  Dispense: 30 capsule; Refill: 0         I was unable to locate a publicly available, detailed formulary specific to your plan ([employer] "NAP" under [employer] with Sponsor/Group # [id number] in [place] ) that shows exactly which stimulant ADHD medications are covered, at what tier, or with what restrictions. Here's what I found and what you can do next:  ? What I found [employer] publishes broad clinical-policy bulletins listing ADHD agents and indicating that many stimulant and related medications are subject to prior authorization (PA) or limits. Mardel) The "Formularies & Pharmacy Clinical Policy Bulletins" page on [employer]'s site offers a search tool for formulary status by plan. Administrator) However, no listing was found specific to your Sponsor/Group number and region which breaks down "covered drugs for stimulants in ADHD" by plan tier for [place] .  ?? What this means The policy bulletins give the rules (e.g., PA required for many ADHD agents) but not which specific versions of each drug (brand, generic, certain manufacturers) are preferred or covered under your exact plan. Without your specific plan's "drug list" (formulary) you cannot be certain: Which drugs are preferred vs non-preferred Which formulations are covered (IR  vs XR, generic vs brand) What the cost/tiers are for each drug What the required steps (step therapy, PAs) are  ?? What you should do next Here's how you can find the exact formulary  information for your plan: Log into your Aetna member portal (via Aetna's website) using your plan information (Sponsor/Group [id number], state [place] ). Use the "Drug formulary / pharmacy benefits" section. Use the formulary search tool on [employer]'s site (see the "Formularies & Pharmacy Clinical Policy Bulletins" page) to enter your plan details. Administrator) If you cannot locate the formulary online: Call the number on your insurance card (or call [employer]'s benefits line) and ask: "What is the pharmacy formulary for Sponsor/Group [id number] in [place] ? Which ADHD stimulant medications are covered, and are there prior authorizations or step therapy requirements?" Ask the pharmacy benefit manager (PBM) associated with your plan (often listed on your ID card) to provide the drug list or tell you the coverage status of specific medications. Once you have the formulary, check for each stimulant medication you are considering: Is the generic version covered? Is the brand version covered (or only through exception)? Are there quantity limits or max daily doses listed? Are there age restrictions or behavioral therapy requirements? What is the cost tier to you (copay/coinsurance)?  Here's a comparison of Adderall XR vs Vyvanse for ADHD--laying out the pros & cons of each. This is general information and not specific medical advice; you should discuss with your provider to see which might be a better fit for you.  Similarities Both are amphetamine-type stimulant medications used to treat ADHD. [facility]) Both increase neurotransmitters (dopamine, norepinephrine) in the brain to help with attention, impulse control, and hyperactivity. [facility]) Both carry risks typical of stimulants: decreased appetite, insomnia, increased heart rate/blood pressure, potential for abuse/ dependence. (WebMD)  Key Differences & their implications Feature Vyvanse Adderall XR  Chemical structure /  activation Pro-drug: lisdexamfetamine converts in the body into active dextroamphetamine. (MentalHealthDaily) Contains mixed amphetamine salts (dextro & levo) that are active more quickly. (Drugs.com)  Onset of action Slower onset (~1-2 hours) because of pro-drug conversion. (Medical News Today) Faster onset (~30-60 minutes) given direct active salts. [facility])  Duration Often up to ~12-14 hours in many patients. (Medical News Today) Up to ~10-12 hours for XR formulation (IR is much shorter). (Medical News Today)  Abuse potential Often considered lower risk because of slower activation; harder to misuse (less "rush"). [facility]) Higher potential for misuse/abuse because of faster onset and active salts. (Drugs.com)  Cost / Insurance / Generic availability Historically brand only (though generics may exist), tends to cost more; insurance coverage may be more restricted. (Drugs.com) Generic versions (mixed amphetamine salts) are available; may cost less; more established coverage. Lorie)   Pros & Cons Vyvanse - Pros Smoother, typically more gradual onset and "cleaner" feel for many patients -- less of a pronounced "peak & crash". (Medical News Today) Longer duration of effect for many people: helps cover the day for extended activities without needing multiple doses. [facility]) Lower potential for misuse (because it must be metabolized to become active) which may be a consideration. [facility]) Vyvanse - Cons Slower onset might mean it takes more time to "kick in" -- for some that delay is less ideal. Because of cost and brand status, may have higher out-of-pocket cost or may be non-preferred on formulary in some insurance plans. (Drugs.com) Even though duration is long for many, some patients still report wear-off or needing adjustments. Adderall XR - Pros  Faster onset can mean quicker symptom relief for some patients, useful when immediate effect is needed. More flexible  in terms of available formulations and generics, possibly lower cost depending on plan and pharmacy. Well-studied and widely prescribed - more experience among providers/patients in many cases. Adderall XR - Cons Because of the faster onset, more likely to have "peak and crash" effect (i.e., high early then dropping off). Some patients experience a more abrupt wearing-off. (Reddit) Potential for higher misuse/abuse risk compared to Vyvanse. Duration may not always be long enough for people needing very late coverage - may need supplemental short-acting dose. (Medical News Today)  Which might be better for you? Consider: Daily schedule: If you need coverage from early morning until late evening, a longer-acting drug like Vyvanse might be advantageous. Onset needs: If you need something to kick in quickly (e.g., early morning performance) the faster onset of Adderall XR might be preferable. Side-effect sensitivity: If you're sensitive to peaks, crashes, or mood swings, the smoother profile of Vyvanse might help. Cost and formulary/insurance: As you're checking your insurance coverage, the cost and tier status of each matters: if one is non-preferred, you might have higher cost or more hurdles. Abuse risk / personal history: If you (or your provider) are concerned about potential for misuse, the lower-abuse-potential profile of Vyvanse may be a benefit. Individual response: Every person responds differently. What works well for one doesn't guarantee it will for another. So if you try one and it doesn't work or has intolerable side-effects, switching is common.  Final takeaway There's no "one size fits all" winner. Both medications are effective, but they differ in how quickly and how long they act, how they feel in daily life (onset, smoothness, wear-off), their cost/insurance realities, and risk profiles. Based on your schedule, side-effect history, insurance coverage (which you are investigating), and  personal response, one may be a better fit. It's wise to discuss with your provider these pros/cons in the context of your health history, schedule, insurance/formulary status, and what you've experienced with past medications.   Key newer ADHD medications and formulations Qelbree (viloxazine extended-release) A non-stimulant medication approved by the Food and Drug Administration (FDA) for children (ages ~6-17) and later for adults. [employer]+3Pharmafile+3WebMD+3 It has the advantage of no controlled-substance (stimulant) classification (i.e., less risk of abuse compared to stimulants). Pharmafile+1 Mechanism: a norepinephrine reuptake inhibitor, among other effects. Wjarr+1 Useful especially for patients who may not tolerate stimulants or have concerns about stimulant side-effects. Azstarys (serdexmethylphenidate + dexmethylphenidate) Approved in 2021 for children aged 19 and older. WebMD+1 Stimulant formulation: it combines a prodrug (serdexmethylphenidate) with active component (dexmethylphenidate) to provide both immediate and extended symptom control. MARK ZAUSS - THERAPY+1 Offers extended duration, which may help with symptom control across the day. Xelstrym (dextroamphetamine transdermal system / patch) A stimulant formulated as a patch, approved for children (6-17) and adults. ADDitude+1 The patch offers an alternate delivery system (e.g., useful for people who have difficulty swallowing pills or want different dosing flexibility). ADDitude Onyda XR (clonidine extended-release oral suspension) Approved in May/June 2024, this is the first liquid, once-daily non-stimulant ADHD med for children aged 53 and older. HealthDay+2[facility]+2 Useful for children (or adults) who have difficulty swallowing tablets or need a non-stimulant adjunctive therapy. HealthDay Adzenys XR-ODT (amphetamine extended-release orally disintegrating tablet) Approved for patients 6 years and older.  An extended-release orally disintegrating tablet (ODT) formulation of amphetamine salts. [employer] for those who may struggle with pills.  Why these matter /  what's new about them These newer medications expand delivery options: e.g., patches Parke), orally disintegrating tablets (Adzenys XR-ODT), liquid suspension (Onyda XR). This helps people who can't swallow pills or prefer alternate dosage forms. They expand non-stimulant options: Qelbree and Onyda XR provide non-stimulant alternatives for patients who cannot tolerate stimulants or have contraindications. They may offer longer duration of action or more stable symptom control across the day. For example, Azstarys was designed to provide both immediate and extended coverage. They may address special populations (children who can't swallow pills, patients with comorbid conditions, etc).  Important notes & caveats Even though these are newer or newer formulations, they still carry side-effects, risks (especially stimulants: abuse potential, cardiovascular effects, insomnia, appetite suppression). For example, in the Adzenys XR-ODT prescribing information, the black-box warning reminds that amphetamine-based products have high potential for abuse. [employer] Non-stimulants are generally lower abuse risk but may be less potent in some cases compared to stimulants (depending on the individual). What "newer" means varies: some of these were approved in 2021-2024; others are formulations of older drugs. Availability (insurance coverage, state/regional approval, cost) may vary. Not all treatments are right for everyone; the choice depends on individual medical history, comorbidities, lifestyle, age, and tolerance of side-effects.   3. Jornay PM  (methylphenidate  HCl extended-release, evening dose) Although first approved in 2018 (so not super recent), it represents a newer formulation/delivery strategy: a  delayed-release/extended-release stimulant taken in the evening, releasing its effect in the morning and throughout the day. Drugs.com+1 Useful for individuals who have early morning functioning problems (i.e., waking up and starting the day before taking a morning pill). Because it is still a stimulant (methylphenidate ) it carries the usual monitoring and controlled substance considerations. FDA Access Data

## 2024-07-04 NOTE — Assessment & Plan Note (Addendum)
 Attention-deficit hyperactivity disorder, combined type   Methylphenidate  shows reduced effectiveness due to genetic factors, with gene-drug interaction results indicating decreased efficacy for both methylphenidate  and dexmethylphenidate. Strattera  is contraindicated due to potential accumulation. Guanfacine is prescribed to alleviate jitteriness and anxiety from methylphenidate . Alternative medications such as Adderall and Vyvanse are considered, with insurance coverage being crucial. Newer options like Elissa Borders, Monette, and Xelstra are also potential choices. A three-month supply of methylphenidate  is provided. He received an informational handout on alternative medications and is advised to discuss options with family and therapist. He should contact the insurance company to verify coverage for alternatives. A seven-day window is given to decide on switching medications before the next methylphenidate  dose is due. PDMP reviewed during this encounter.

## 2024-07-04 NOTE — Progress Notes (Addendum)
 ==============================  [facility]: [phone]   -- Medical Office Visit --  Patient: Nathan Hill      Age: 18 y.o.       Sex:  male  Date:   07/04/2024 Today's Healthcare Provider: Bernardino KANDICE Cone, MD  ==============================   Chief Complaint: ADHD and Medication Refill  Discussed the use of AI scribe software for clinical note transcription with the patient, who gave verbal consent to proceed.  History of Present Illness  18 year old male with ADHD who presents for medication management.  He is currently taking methylphenidate  for ADHD management, with the dosage having been gradually increased over time. Despite being on a high dose, the medication is not as effective as desired. He has not tried other ADHD medications such as Adderall or Vyvanse.  A recent gene site test indicated a gene-drug interaction with methylphenidate  and dexmethylphenidate. The test also showed a gene-drug interaction with Strattera . He is currently taking a low dose of Strattera  (10 mg).  His family, including his mom and dad, are involved in his care, as they plan to discuss the gene site results with his therapist. He wants to consult with his family and therapist before making any changes to his medication regimen.  No current issues with mood, stating he is in a good mood and has no depression, attributing his positive outlook to not worrying 'a lot.'  He is also taking amoxicillin, although the reason for this medication is not specified.  He works at [employer].  Background Reviewed: Problem List: has Attention deficit hyperactivity disorder; Scoliosis; High risk medication use; and Cognitive and neurobehavioral dysfunction on their problem list. Past Medical History:  has a past medical history of Anxiety and Scoliosis (05/13/2022). Past Surgical History:   has no past surgical history on file. Social History:   reports that he has  never smoked. He has never used smokeless tobacco. He reports that he does not drink alcohol and does not use drugs. Family History:  family history includes Cancer in his maternal grandmother and paternal grandmother; Early death in his maternal grandmother and paternal grandmother. Allergies:  has no known allergies.   Medication Reconciliation: Current Outpatient Medications on File Prior to Visit  Medication Sig   Adapalene -Benzoyl Peroxide  0.3-2.5 % GEL Place 1 Application onto the skin daily at 6 (six) AM.   atomoxetine  (STRATTERA ) 10 MG capsule Take 1 capsule (10 mg total) by mouth daily.   Benzoyl Peroxide  (PANOXYL FOAMING WASH) 10 % LIQD 1 application Externally Once a day   chlorhexidine (PERIDEX) 0.12 % solution SMARTSIG:0.5 Ounce(s) By Mouth Twice Daily   clindamycin (CLINDAGEL) 1 % gel 1 application Externally Pea size amount to whole face in the morning   methylphenidate  (METADATE  CD) 60 MG CR capsule Take 1 capsule (60 mg total) by mouth every morning.   methylphenidate  (METADATE  CD) 60 MG CR capsule Take 1 capsule (60 mg total) by mouth every morning.   methylphenidate  (METADATE  CD) 60 MG CR capsule Take 1 capsule (60 mg total) by mouth every morning.   methylphenidate  (METADATE  CD) 60 MG CR capsule Take 1 capsule (60 mg total) by mouth every morning.   tretinoin (RETIN-A) 0.025 % cream 1 application in the evening to face Externally Pea size amount to whole face every other night for 30 days   No current facility-administered medications on file prior to visit.   Medications Discontinued During This Encounter  Medication Reason   methylphenidate  (METADATE   CD) 60 MG CR capsule Reorder   methylphenidate  (METADATE  CD) 60 MG CR capsule Reorder   methylphenidate  (METADATE  CD) 60 MG CR capsule Reorder     Physical Exam:    07/04/2024   11:03 AM 04/03/2024    1:37 PM 01/01/2024    1:05 PM  Vitals with BMI  Height 5' 10 5' 10 5' 10  Weight 149 lbs 6 oz 145 lbs 13 oz 133 lbs  13 oz  BMI 21.44 20.92 19.2  Systolic 110 110 889  Diastolic 72 66 60  Pulse 94 69 89  Vital signs reviewed.  Nursing notes reviewed. Weight trend reviewed. Physical Activity: Sufficiently Active (07/03/2023)   Exercise Vital Sign    Days of Exercise per Week: 3 days    Minutes of Exercise per Session: 60 min   General Appearance:  No acute distress appreciable.   Well-groomed, healthy-appearing male.  Well proportioned with no abnormal fat distribution.  Good muscle tone. Pulmonary:  Normal work of breathing at rest, no respiratory distress apparent. SpO2: 98 %  Musculoskeletal: All extremities are intact.  Neurological:  Awake, alert, oriented, and engaged.  No obvious focal neurological deficits or cognitive impairments.  Sensorium seems unclouded.   Speech is clear and coherent with logical content. Psychiatric:  Appropriate mood, pleasant and cooperative demeanor, thoughtful and engaged during the exam     10/04/2023    1:31 PM 07/03/2023    8:16 AM 04/27/2023   11:28 AM 11/16/2022   10:48 AM  PHQ 2/9 Scores  PHQ - 2 Score 0 1 1 1   PHQ- 9 Score 2  3  3  2       Data saved with a previous flowsheet row definition   Office Visit on 07/03/2023  Component Date Value Ref Range Status   Amphetamines, Urine 07/03/2023 Negative  Cutoff=1000 ng/mL Final   Cannabinoid Quant, Ur 07/03/2023 Negative  Cutoff=50 ng/mL Final   Cocaine (Metab.) 07/03/2023 Negative  Cutoff=300 ng/mL Final   OPIATE QUANTITATIVE URINE 07/03/2023 Negative  Cutoff=2000 ng/mL Final   PCP Quant, Ur 07/03/2023 Negative  Cutoff=25 ng/mL Final  Lab on 08/18/2022  Component Date Value Ref Range Status   WBC 08/18/2022 7.2  4.5 - 13.5 K/uL Final   RBC 08/18/2022 5.53  3.80 - 5.70 Mil/uL Final   Hemoglobin 08/18/2022 15.8  12.0 - 16.0 g/dL Final   HCT 87/78/7976 47.2  36.0 - 49.0 % Final   MCV 08/18/2022 85.3  78.0 - 98.0 fl Final   MCHC 08/18/2022 33.6  31.0 - 37.0 g/dL Final   RDW 87/78/7976 13.2  11.4 - 15.5 % Final    Platelets 08/18/2022 274.0  150.0 - 575.0 K/uL Final   Neutrophils Relative % 08/18/2022 41.0 (L)  43.0 - 71.0 % Final   Lymphocytes Relative 08/18/2022 38.5  24.0 - 48.0 % Final   Monocytes Relative 08/18/2022 9.9  3.0 - 12.0 % Final   Eosinophils Relative 08/18/2022 9.9 (H)  0.0 - 5.0 % Final   Basophils Relative 08/18/2022 0.7  0.0 - 3.0 % Final   Neutro Abs 08/18/2022 2.9  1.4 - 7.7 K/uL Final   Lymphs Abs 08/18/2022 2.8  0.7 - 4.0 K/uL Final   Monocytes Absolute 08/18/2022 0.7  0.1 - 1.0 K/uL Final   Eosinophils Absolute 08/18/2022 0.7  0.0 - 0.7 K/uL Final   Basophils Absolute 08/18/2022 0.1  0.0 - 0.1 K/uL Final   Sodium 08/18/2022 140  135 - 145 mEq/L Final   Potassium 08/18/2022 4.0  3.5 - 5.1 mEq/L Final   Chloride 08/18/2022 102  96 - 112 mEq/L Final   CO2 08/18/2022 31  19 - 32 mEq/L Final   Glucose, Bld 08/18/2022 83  70 - 99 mg/dL Final   BUN 87/78/7976 10  6 - 23 mg/dL Final   Creatinine, Ser 08/18/2022 1.01  0.40 - 1.50 mg/dL Final   Total Bilirubin 08/18/2022 0.4  0.3 - 1.2 mg/dL Final   Alkaline Phosphatase 08/18/2022 80  52 - 171 U/L Final   AST 08/18/2022 15  0 - 37 U/L Final   ALT 08/18/2022 12  0 - 53 U/L Final   Total Protein 08/18/2022 7.1  6.0 - 8.3 g/dL Final   Albumin 87/78/7976 4.6  3.5 - 5.2 g/dL Final   GFR 87/78/7976 108.25  >60.00 mL/min Final   Calcium 08/18/2022 9.8  8.4 - 10.5 mg/dL Final   Cholesterol 87/78/7976 148  0 - 200 mg/dL Final   Triglycerides 87/78/7976 95.0  0.0 - 149.0 mg/dL Final   HDL 87/78/7976 52.20  >39.00 mg/dL Final   VLDL 87/78/7976 19.0  0.0 - 40.0 mg/dL Final   LDL Cholesterol 08/18/2022 77  0 - 99 mg/dL Final   Total CHOL/HDL Ratio 08/18/2022 3   Final   NonHDL 08/18/2022 96.06   Final       ASSESSMENT & PLAN   Assessment & Plan Attention deficit hyperactivity disorder (ADHD), combined type Cognitive and neurobehavioral dysfunction High risk medication use Attention-deficit hyperactivity disorder, combined type    Methylphenidate  shows reduced effectiveness due to genetic factors, with gene-drug interaction results indicating decreased efficacy for both methylphenidate  and dexmethylphenidate. Strattera  is contraindicated due to potential accumulation. Guanfacine  is prescribed to alleviate jitteriness and anxiety from methylphenidate . Alternative medications such as Adderall and Vyvanse are considered, with insurance coverage being crucial. Newer options like Elissa Borders, Monette, and Xelstra are also potential choices. A three-month supply of methylphenidate  is provided. He received an informational handout on alternative medications and is advised to discuss options with family and therapist. He should contact the insurance company to verify coverage for alternatives. A seven-day window is given to decide on switching medications before the next methylphenidate  dose is due. PDMP reviewed during this encounter. Attention deficit hyperactivity disorder (ADHD), predominantly inattentive type Attention-deficit hyperactivity disorder, combined type   Methylphenidate  shows reduced effectiveness due to genetic factors, with gene-drug interaction results indicating decreased efficacy for both methylphenidate  and dexmethylphenidate. Strattera  is contraindicated due to potential accumulation. Guanfacine  is prescribed to alleviate jitteriness and anxiety from methylphenidate . Alternative medications such as Adderall and Vyvanse are considered, with insurance coverage being crucial. Newer options like Elissa Borders, Monette, and Xelstra are also potential choices. A three-month supply of methylphenidate  is provided. He received an informational handout on alternative medications and is advised to discuss options with family and therapist. He should contact the insurance company to verify coverage for alternatives. A seven-day window is given to decide on switching medications before the next methylphenidate  dose is  due. PDMP reviewed during this encounter.   ORDER ASSOCIATIONS  #   DIAGNOSIS / CONDITION ICD-10 ENCOUNTER ORDER     ICD-10-CM   1. Attention deficit hyperactivity disorder (ADHD), combined type  F90.2 guanFACINE  (INTUNIV ) 1 MG TB24 ER tablet    methylphenidate  (METADATE  CD) 60 MG CR capsule    methylphenidate  (METADATE  CD) 60 MG CR capsule    methylphenidate  (METADATE  CD) 60 MG CR capsule    2. High risk medication use  Z79.899 methylphenidate  (METADATE  CD) 60  MG CR capsule    methylphenidate  (METADATE  CD) 60 MG CR capsule    methylphenidate  (METADATE  CD) 60 MG CR capsule    3. Cognitive and neurobehavioral dysfunction  F09 methylphenidate  (METADATE  CD) 60 MG CR capsule    methylphenidate  (METADATE  CD) 60 MG CR capsule    methylphenidate  (METADATE  CD) 60 MG CR capsule     Meds ordered this encounter  Medications   guanFACINE  (INTUNIV ) 1 MG TB24 ER tablet    Sig: Take 1 tablet (1 mg total) by mouth daily.    Dispense:  30 tablet    Refill:  0   methylphenidate  (METADATE  CD) 60 MG CR capsule    Sig: Take 1 capsule (60 mg total) by mouth every morning.    Dispense:  30 capsule    Refill:  0   methylphenidate  (METADATE  CD) 60 MG CR capsule    Sig: Take 1 capsule (60 mg total) by mouth every morning.    Dispense:  30 capsule    Refill:  0   methylphenidate  (METADATE  CD) 60 MG CR capsule    Sig: Take 1 capsule (60 mg total) by mouth every morning.    Dispense:  30 capsule    Refill:  0        This document was synthesized by artificial intelligence (Abridge) using HIPAA-compliant recording of the clinical interaction;   We discussed the use of AI scribe software for clinical note transcription with the patient, who gave verbal consent to proceed. additional Info: This encounter employed state-of-the-art, real-time, collaborative documentation. The patient actively reviewed and assisted in updating their electronic medical record on a shared screen, ensuring transparency and  facilitating joint problem-solving for the problem list, overview, and plan. This approach promotes accurate, informed care. The treatment plan was discussed and reviewed in detail, including medication safety, potential side effects, and all patient questions. We confirmed understanding and comfort with the plan. Follow-up instructions were established, including contacting the office for any concerns, returning if symptoms worsen, persist, or new symptoms develop, and precautions for potential emergency department visits.

## 2024-08-01 ENCOUNTER — Other Ambulatory Visit: Payer: Self-pay | Admitting: Internal Medicine

## 2024-08-01 DIAGNOSIS — F902 Attention-deficit hyperactivity disorder, combined type: Secondary | ICD-10-CM

## 2024-09-03 ENCOUNTER — Encounter: Payer: Self-pay | Admitting: Internal Medicine

## 2024-09-03 MED ORDER — METHYLPHENIDATE HCL ER (CD) 60 MG PO CPCR: 60.0000 mg | ORAL_CAPSULE | ORAL | 0 refills | Status: AC

## 2024-09-03 MED ORDER — METHYLPHENIDATE HCL ER (CD) 60 MG PO CPCR
60.0000 mg | ORAL_CAPSULE | ORAL | 0 refills | Status: AC
  Filled 2024-09-03: qty 30, 30d supply, fill #0

## 2024-09-03 NOTE — Assessment & Plan Note (Signed)
 Attention-deficit hyperactivity disorder, combined type   Methylphenidate  shows reduced effectiveness due to genetic factors, with gene-drug interaction results indicating decreased efficacy for both methylphenidate  and dexmethylphenidate. Strattera  is contraindicated due to potential accumulation. Guanfacine is prescribed to alleviate jitteriness and anxiety from methylphenidate . Alternative medications such as Adderall and Vyvanse are considered, with insurance coverage being crucial. Newer options like Elissa Borders, Monette, and Xelstra are also potential choices. A three-month supply of methylphenidate  is provided. He received an informational handout on alternative medications and is advised to discuss options with family and therapist. He should contact the insurance company to verify coverage for alternatives. A seven-day window is given to decide on switching medications before the next methylphenidate  dose is due. PDMP reviewed during this encounter.

## 2024-09-03 NOTE — Addendum Note (Signed)
 Addended by: Desmon Hitchner G on: 09/03/2024 08:29 PM   Modules accepted: Orders

## 2024-09-04 ENCOUNTER — Other Ambulatory Visit (HOSPITAL_BASED_OUTPATIENT_CLINIC_OR_DEPARTMENT_OTHER): Payer: Self-pay

## 2024-09-05 MED ORDER — METHYLPHENIDATE HCL ER (CD) 60 MG PO CPCR: 60.0000 mg | ORAL_CAPSULE | ORAL | 0 refills | Status: AC

## 2024-09-05 NOTE — Addendum Note (Signed)
 Addended by: Tyrie Porzio G on: 09/05/2024 08:51 PM   Modules accepted: Orders

## 2024-09-23 ENCOUNTER — Ambulatory Visit: Admitting: Internal Medicine

## 2024-09-23 ENCOUNTER — Encounter: Payer: Self-pay | Admitting: Internal Medicine

## 2024-09-23 DIAGNOSIS — F902 Attention-deficit hyperactivity disorder, combined type: Secondary | ICD-10-CM | POA: Diagnosis not present

## 2024-09-23 MED ORDER — AMPHETAMINE-DEXTROAMPHET ER 30 MG PO CP24: 30.0000 mg | ORAL_CAPSULE | ORAL | 0 refills | Status: AC

## 2024-09-23 MED ORDER — LISDEXAMFETAMINE DIMESYLATE 60 MG PO CAPS: 60.0000 mg | ORAL_CAPSULE | ORAL | 0 refills | Status: AC

## 2024-09-23 MED ORDER — AMPHETAMINE-DEXTROAMPHETAMINE 20 MG PO TABS: 20.0000 mg | ORAL_TABLET | Freq: Two times a day (BID) | ORAL | 0 refills | Status: AC

## 2024-09-23 MED ORDER — GUANFACINE HCL ER 2 MG PO TB24: 2.0000 mg | ORAL_TABLET | Freq: Every day | ORAL | 1 refills | Status: AC

## 2024-09-23 NOTE — Assessment & Plan Note (Addendum)
 Attention deficit hyperactivity disorder (ADHD), combined type   He has been using methylphenidate  (Metadate  CD) 60 mg daily long-term but will transition to Adderall XR 30 mg extended release and Adderall 20 mg immediate release due to availability issues with Metadate  CD. Guanfacine  (Intuniv ) 1 mg is used for jitter control, with a potential dose increase if Adderall causes increased jitteriness. A trial of Vyvanse  60 mg is considered as an alternative medication. He is instructed not to take both Adderall and Vyvanse  simultaneously. Adderall XR 30 mg is prescribed for morning use, and Adderall 20 mg immediate release for the afternoon, starting with a half or quarter dose to assess tolerance. Guanfacine  1 mg is prescribed, with an option to increase to 2 mg if needed. Vyvanse  60 mg is prescribed as a trial replacement for Metadate  CD, pending availability and price check at the pharmacy. He is advised to trial Adderall and Vyvanse  separately and report back within 3-4 days with feedback on medication efficacy and preference for long-term use. PDMP reviewed during this encounter.

## 2024-09-23 NOTE — Progress Notes (Signed)
 ====================================  [facility]: [phone]   --  Virtual Video Medical Office Visit --  Patient: Nathan Hill      Age: 19 y.o.       Sex:  male  Date:   09/23/2024 Today's Healthcare Provider: Bernardino KANDICE Cone, MD  ====================================    Chief Complaint/Reason For Visit: Medication Refill Parents wanted me to talk about if methylphenidate  is right for me due to recent access problems  Chart reviewed: has Attention deficit hyperactivity disorder; Scoliosis; High risk medication use; and Cognitive and neurobehavioral dysfunction on their problem list. Chart reviewed:  has a past medical history of Anxiety and Scoliosis (05/13/2022). Discussed the use of AI scribe software for clinical note transcription with the patient, who gave verbal consent to proceed.  History of Present Illness 19 year old male with ADHD who presents for medication management.  He has been on methylphenidate , specifically Metadate  CD 60 mg, for many years, which he takes in the morning. Recently, he has experienced difficulty obtaining Metadate  due to supply issues. He has been proactive in managing this by calling multiple pharmacies and, with the help of his parents, was able to find a pharmacy that had the medication available.  He is currently taking guanfacine  1 mg, a 24-hour release formulation, in the morning. No issues with obtaining or affording this medication. He previously took atomoxetine  but has discontinued it, and was not wanting it though he still receiving reminders to pick it up from the pharmacy.  He works at [employer]. 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   Medications reviewed: Current Outpatient Medications on File Prior to Visit  Medication Sig   Adapalene -Benzoyl Peroxide  0.3-2.5 % GEL Place 1 Application onto the skin daily  at 6 (six) AM.   Benzoyl Peroxide  (PANOXYL FOAMING WASH) 10 % LIQD 1 application Externally Once a day   chlorhexidine (PERIDEX) 0.12 % solution SMARTSIG:0.5 Ounce(s) By Mouth Twice Daily   clindamycin (CLINDAGEL) 1 % gel 1 application Externally Pea size amount to whole face in the morning   guanFACINE  (INTUNIV ) 1 MG TB24 ER tablet TAKE 1 TABLET BY MOUTH EVERY DAY   methylphenidate  (METADATE  CD) 60 MG CR capsule Take 1 capsule (60 mg total) by mouth every morning.   methylphenidate  (METADATE  CD) 60 MG CR capsule Take 1 capsule (60 mg total) by mouth every morning.   methylphenidate  (METADATE  CD) 60 MG CR capsule Take 1 capsule (60 mg total) by mouth every morning.   methylphenidate  (METADATE  CD) 60 MG CR capsule Take 1 capsule (60 mg total) by mouth every morning.   methylphenidate  (METADATE  CD) 60 MG CR capsule Take 1 capsule (60 mg total) by mouth every morning.   methylphenidate  (METADATE  CD) 60 MG CR capsule Take 1 capsule (60 mg total) by mouth every morning.   methylphenidate  (METADATE  CD) 60 MG CR capsule Take 1 capsule (60 mg total) by mouth every morning.   methylphenidate  (METADATE  CD) 60 MG CR capsule Take 1 capsule (60 mg total) by mouth every morning.   tretinoin (RETIN-A) 0.025 % cream 1 application in the evening to face Externally Pea size amount to whole face every other night for 30 days   No current facility-administered medications on file prior to visit.   Medications Discontinued During This Encounter  Medication Reason   atomoxetine  (STRATTERA ) 10 MG capsule        Virtual Physical  Exam: Exam Context: Evaluation limited by virtual format; however, patient is clearly visualized, cooperative, and engaged throughout. General Appearance: Well-developed, well-nourished; no acute distress by limited video assessment. Pulmonary: No respiratory distress apparent; normal work of breathing observed. Neurological: Patient is awake, alert, and demonstrates no obvious focal  neurological deficits or cognitive impairments; sensorium appears unclouded. Psychiatric/Mental Status: Mood is appropriate; demeanor is pleasant, calm, and articulate. Speech is coherent and goal-directed with no evidence of slurred or pressured speech. No abnormal psychomotor activity noted. Substance Misuse Indicators: Pupils appear symmetric and reactive as far as can be assessed via video. No track marks, skin lesions, or other stigmata of substance misuse visible. No signs of intoxication or withdrawal are evident.  No visits with results within 1 Year(s) from this visit.  Latest known visit with results is:  Office Visit on 07/03/2023  Component Date Value   Amphetamines, Urine 07/03/2023 Negative    Cannabinoid Quant, Ur 07/03/2023 Negative    Cocaine (Metab.) 07/03/2023 Negative    OPIATE QUANTITATIVE URINE 07/03/2023 Negative    PCP Quant, Ur 07/03/2023 Negative   No image results found. No results found.DG Ankle Complete Right Result Date: 11/26/2010 *RADIOLOGY REPORT* Clinical Data: Fell twisting ankle with pain laterally RIGHT ANKLE - COMPLETE 3+ VIEW Comparison: None. Findings: The ankle joint appears normal.  No acute fracture is seen.  Alignment is normal.  There is soft tissue swelling over lateral malleolus. IMPRESSION: No acute fracture. 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 Guanfacine  (Intuniv ) 1 mg is used for jitter control, with a potential dose increase if Adderall causes increased jitteriness. A trial of Vyvanse  60 mg is considered as an alternative medication. He is instructed not to take both Adderall and Vyvanse   simultaneously. Adderall XR 30 mg is prescribed for morning use, and Adderall 20 mg immediate release for the afternoon, starting with a half or quarter dose to assess tolerance. Guanfacine  1 mg is prescribed, with an option to increase to 2 mg if needed. Vyvanse  60 mg is prescribed as a trial replacement for Metadate  CD, pending availability and price check at the pharmacy. He is advised to trial Adderall and Vyvanse  separately and report back within 3-4 days with feedback on medication efficacy and preference for long-term use. PDMP reviewed during this encounter.    ORDER ASSOCIATIONS  #   DIAGNOSIS / CONDITION ICD-10 ENCOUNTER ORDER     ICD-10-CM   1. Attention deficit hyperactivity disorder (ADHD), combined type  F90.2 amphetamine -dextroamphetamine  (ADDERALL XR) 30 MG 24 hr capsule    amphetamine -dextroamphetamine  (ADDERALL) 20 MG tablet    guanFACINE  (INTUNIV ) 2 MG TB24 ER tablet    lisdexamfetamine  (VYVANSE ) 60 MG capsule          Orders Placed in Encounter:   Meds ordered this encounter  Medications   amphetamine -dextroamphetamine  (ADDERALL XR) 30 MG 24 hr capsule    Sig: Take 1 capsule (30 mg total) by mouth every morning. Take in am with guanfacine  do not take with vyvanse     Dispense:  7 capsule    Refill:  0    Trial replaccement metadate ,ok to also trial vyvanse    amphetamine -dextroamphetamine  (ADDERALL) 20 MG tablet  Sig: Take 1 tablet (20 mg total) by mouth 2 (two) times daily. Take in afternoon if Adderall XR wears off.    Dispense:  7 tablet    Refill:  0   guanFACINE  (INTUNIV ) 2 MG TB24 ER tablet    Sig: Take 1 tablet (2 mg total) by mouth daily.    Dispense:  90 tablet    Refill:  1   lisdexamfetamine  (VYVANSE ) 60 MG capsule    Sig: Take 1 capsule (60 mg total) by mouth every morning.    Dispense:  7 capsule    Refill:  0        Treatment plan discussed and reviewed in detail. Explained medication safety and potential side effects.  Answered all patient  questions and confirmed understanding and comfort with the plan. Encouraged patient to contact our office if they have any questions or concerns.  Agreed on patient coming for a sooner office visit if symptoms worsen, persist, or new symptoms develop. Discussed precautions in case of needing to visit the Emergency Department.    ----------------------------------------------------- Attestation:  Today's Healthcare Provider Bernardino KANDICE Cone, MD was located at office at [facility] [address], [place] [address].  The patient was located at home. All video encounter participant identities and locations confirmed visually and verbally.Today's Telemedicine visit was conducted via synchronous Video after consent for telemedicine was obtained:  Video connection was never lost   This document was transcribed and resynthesized, in part, by artificial intelligence (Abridge)  using HIPAA-compliant recording of the clinical interaction;   We have discussed the our use of AI scribe software for clinical note transcription with the patient, who has given verbal consent to proceed.

## 2024-09-23 NOTE — Patient Instructions (Signed)
 It was a pleasure seeing you today! Your health and satisfaction are our top priorities.  Nathan Cone, MD  VISIT SUMMARY: During your visit, we discussed your ongoing management of ADHD and the recent difficulties in obtaining your usual medication, Metadate  CD. We have made adjustments to your medication regimen to address these issues.  YOUR PLAN: -ATTENTION DEFICIT HYPERACTIVITY DISORDER (ADHD), COMBINED TYPE: ADHD is a condition characterized by symptoms of inattention, hyperactivity, and impulsivity. Due to the unavailability of Metadate  CD, we are transitioning you to Adderall XR 30 mg for morning use and Adderall 20 mg immediate release for the afternoon. Start with a half or quarter dose of the afternoon Adderall to assess your tolerance. You will continue taking Guanfacine  1 mg for jitter control, with the option to increase to 2 mg if needed. Additionally, we are considering a trial of Vyvanse  60 mg as an alternative medication. Do not take both Adderall and Vyvanse  at the same time. Please report back within 3-4 days to provide feedback on the efficacy and your preference for long-term use.  INSTRUCTIONS: Please follow the new medication regimen as discussed. Start with Adderall XR 30 mg in the morning and Adderall 20 mg immediate release in the afternoon, beginning with a half or quarter dose. Continue taking Guanfacine  1 mg, with the option to increase to 2 mg if needed. Trial Vyvanse  60 mg as an alternative, but do not take it simultaneously with Adderall. Report back within 3-4 days with feedback on the medication's effectiveness and your preference for long-term use.  Your Providers PCP: Hill Nathan MATSU, MD,  [phone]) Referring Provider: Cone Nathan MATSU, MD,  [phone])  NEXT STEPS: [x]  Early Intervention: Schedule sooner appointment, call our on-call services, or go to emergency room if there is any significant Increase in pain or discomfort New or worsening  symptoms Sudden or severe changes in your health [x]  Flexible Follow-Up: We recommend a No follow-ups on file. for optimal routine care. This allows for progress monitoring and treatment adjustments. [x]  Preventive Care: Schedule your annual preventive care visit! It's typically covered by insurance and helps identify potential health issues early. [x]  Lab & X-ray Appointments: Incomplete tests scheduled today, or call to schedule. X-rays: [facility] (M-F, 8:30am-noon or 1pm-5pm). [x]  Medical Information Release: Sign a release form at front desk to obtain relevant medical information we don't have.  MAKING THE MOST OF OUR FOCUSED 20 MINUTE APPOINTMENTS: [x]   Clearly state your top concerns at the beginning of the visit to focus our discussion [x]   If you anticipate you will need more time, please inform the front desk during scheduling - we can book multiple appointments in the same week. [x]   If you have transportation problems- use our convenient video appointments or ask about transportation support. [x]   We can get down to business faster if you use MyChart to update information before the visit and submit non-urgent questions before your visit. Thank you for taking the time to provide details through MyChart.  Let our nurse know and she can import this information into your encounter documents.  Arrival and Wait Times: [x]   Arriving on time ensures that everyone receives prompt attention. [x]   Early morning (8a) and afternoon (1p) appointments tend to have shortest wait times. [x]   Unfortunately, we cannot delay appointments for late arrivals or hold slots during phone calls.  Getting Answers and Following Up [x]   Simple Questions & Concerns: For quick questions or basic follow-up after your visit, reach  us  at [phone] or MyChart messaging. [x]   Complex Concerns: If your concern is more complex, scheduling an appointment might be best. Discuss this with the staff to  find the most suitable option. [x]   Lab & Imaging Results: We'll contact you directly if results are abnormal or you don't use MyChart. Most normal results will be on MyChart within 2-3 business days, with a review message from Dr. Jesus. Haven't heard back in 2 weeks? Need results sooner? Contact us  at [phone]. [x]   Referrals: Our referral coordinator will manage specialist referrals. The specialist's office should contact you within 2 weeks to schedule an appointment. Call us  if you haven't heard from them after 2 weeks.  Staying Connected [x]   MyChart: Activate your MyChart for the fastest way to access results and message us . See the last page of this paperwork for instructions on how to activate.  Bring to Your Next Appointment [x]   Medications: Please bring all your medication bottles to your next appointment to ensure we have an accurate record of your prescriptions. [x]   Health Diaries: If you're monitoring any health conditions at home, keeping a diary of your readings can be very helpful for discussions at your next appointment.  Billing [x]   X-ray & Lab Orders: These are billed by separate companies. Contact the invoicing company directly for questions or concerns. [x]   Visit Charges: Discuss any billing inquiries with our administrative services team.  Your Satisfaction Matters [x]   Share Your Experience: We strive for your satisfaction! If you have any complaints, or preferably compliments, please let Dr. Jesus know directly or contact our Practice Administrators, Manuelita Rubin or [employer], by asking at the front desk.   Reviewing Your Records [x]   Review this early draft of your clinical encounter notes below and the final encounter summary tomorrow on MyChart after its been completed.  All orders placed so far are visible here: Attention deficit hyperactivity disorder (ADHD), combined type -     Amphetamine -Dextroamphet ER; Take 1 capsule (30 mg total) by mouth  every morning. Take in am with guanfacine  do not take with vyvanse   Dispense: 7 capsule; Refill: 0 -     Amphetamine -Dextroamphetamine ; Take 1 tablet (20 mg total) by mouth 2 (two) times daily. Take in afternoon if Adderall XR wears off.  Dispense: 7 tablet; Refill: 0 -     guanFACINE  HCl ER; Take 1 tablet (2 mg total) by mouth daily.  Dispense: 90 tablet; Refill: 1 -     Lisdexamfetamine  Dimesylate; Take 1 capsule (60 mg total) by mouth every morning.  Dispense: 7 capsule; Refill: 0

## 2024-10-04 ENCOUNTER — Ambulatory Visit: Admitting: Internal Medicine
